# Patient Record
Sex: Male | Born: 1970
Health system: Southern US, Community
[De-identification: ages and names within clinical notes are randomized; demographics above are authoritative.]

## PROBLEM LIST (undated history)

## (undated) DIAGNOSIS — E119 Type 2 diabetes mellitus without complications: Secondary | ICD-10-CM

## (undated) DIAGNOSIS — I509 Heart failure, unspecified: Secondary | ICD-10-CM

## (undated) DIAGNOSIS — I2510 Atherosclerotic heart disease of native coronary artery without angina pectoris: Secondary | ICD-10-CM

## (undated) DIAGNOSIS — I10 Essential (primary) hypertension: Secondary | ICD-10-CM

## (undated) HISTORY — PX: CORONARY ARTERY BYPASS GRAFT: SHX141

---

## 2013-09-15 DIAGNOSIS — E119 Type 2 diabetes mellitus without complications: Secondary | ICD-10-CM

## 2015-05-17 DIAGNOSIS — I255 Ischemic cardiomyopathy: Secondary | ICD-10-CM | POA: Diagnosis present

## 2015-05-17 DIAGNOSIS — I2510 Atherosclerotic heart disease of native coronary artery without angina pectoris: Secondary | ICD-10-CM | POA: Diagnosis present

## 2015-06-11 DIAGNOSIS — I10 Essential (primary) hypertension: Secondary | ICD-10-CM | POA: Diagnosis present

## 2016-11-23 ENCOUNTER — Encounter (HOSPITAL_BASED_OUTPATIENT_CLINIC_OR_DEPARTMENT_OTHER): Payer: Self-pay | Admitting: *Deleted

## 2016-11-23 ENCOUNTER — Inpatient Hospital Stay (HOSPITAL_BASED_OUTPATIENT_CLINIC_OR_DEPARTMENT_OTHER)
Admission: EM | Admit: 2016-11-23 | Discharge: 2016-11-25 | DRG: 194 | Disposition: A | Discharge status: Home / Self Care | Payer: BLUE CROSS/BLUE SHIELD | Attending: Family Medicine | Admitting: Family Medicine

## 2016-11-23 ENCOUNTER — Emergency Department (HOSPITAL_BASED_OUTPATIENT_CLINIC_OR_DEPARTMENT_OTHER): Payer: BLUE CROSS/BLUE SHIELD

## 2016-11-23 DIAGNOSIS — E1165 Type 2 diabetes mellitus with hyperglycemia: Secondary | ICD-10-CM | POA: Diagnosis present

## 2016-11-23 DIAGNOSIS — Z833 Family history of diabetes mellitus: Secondary | ICD-10-CM

## 2016-11-23 DIAGNOSIS — Z951 Presence of aortocoronary bypass graft: Secondary | ICD-10-CM

## 2016-11-23 DIAGNOSIS — I5022 Chronic systolic (congestive) heart failure: Secondary | ICD-10-CM | POA: Diagnosis present

## 2016-11-23 DIAGNOSIS — E785 Hyperlipidemia, unspecified: Secondary | ICD-10-CM | POA: Diagnosis present

## 2016-11-23 DIAGNOSIS — Z87891 Personal history of nicotine dependence: Secondary | ICD-10-CM

## 2016-11-23 DIAGNOSIS — Z8249 Family history of ischemic heart disease and other diseases of the circulatory system: Secondary | ICD-10-CM

## 2016-11-23 DIAGNOSIS — I255 Ischemic cardiomyopathy: Secondary | ICD-10-CM | POA: Diagnosis present

## 2016-11-23 DIAGNOSIS — I10 Essential (primary) hypertension: Secondary | ICD-10-CM | POA: Diagnosis present

## 2016-11-23 DIAGNOSIS — I2510 Atherosclerotic heart disease of native coronary artery without angina pectoris: Secondary | ICD-10-CM | POA: Diagnosis present

## 2016-11-23 DIAGNOSIS — N179 Acute kidney failure, unspecified: Secondary | ICD-10-CM

## 2016-11-23 DIAGNOSIS — R05 Cough: Secondary | ICD-10-CM | POA: Diagnosis not present

## 2016-11-23 DIAGNOSIS — I110 Hypertensive heart disease with heart failure: Secondary | ICD-10-CM | POA: Diagnosis present

## 2016-11-23 DIAGNOSIS — R739 Hyperglycemia, unspecified: Secondary | ICD-10-CM

## 2016-11-23 DIAGNOSIS — E119 Type 2 diabetes mellitus without complications: Secondary | ICD-10-CM

## 2016-11-23 DIAGNOSIS — J189 Pneumonia, unspecified organism: Principal | ICD-10-CM | POA: Diagnosis present

## 2016-11-23 DIAGNOSIS — Z7984 Long term (current) use of oral hypoglycemic drugs: Secondary | ICD-10-CM

## 2016-11-23 HISTORY — DX: Type 2 diabetes mellitus without complications: E11.9

## 2016-11-23 HISTORY — DX: Atherosclerotic heart disease of native coronary artery without angina pectoris: I25.10

## 2016-11-23 HISTORY — DX: Essential (primary) hypertension: I10

## 2016-11-23 LAB — CBC WITH DIFFERENTIAL/PLATELET
Basophils Absolute: 0 10*3/uL (ref 0.0–0.1)
Basophils Relative: 0 %
Eosinophils Absolute: 0 10*3/uL (ref 0.0–0.7)
Eosinophils Relative: 0 %
HEMATOCRIT: 43.3 % (ref 39.0–52.0)
HEMOGLOBIN: 14.2 g/dL (ref 13.0–17.0)
LYMPHS ABS: 1.9 10*3/uL (ref 0.7–4.0)
LYMPHS PCT: 17 %
MCH: 28.7 pg (ref 26.0–34.0)
MCHC: 32.8 g/dL (ref 30.0–36.0)
MCV: 87.5 fL (ref 78.0–100.0)
MONOS PCT: 11 %
Monocytes Absolute: 1.2 10*3/uL — ABNORMAL HIGH (ref 0.1–1.0)
NEUTROS ABS: 8 10*3/uL — AB (ref 1.7–7.7)
NEUTROS PCT: 72 %
Platelets: 309 10*3/uL (ref 150–400)
RBC: 4.95 MIL/uL (ref 4.22–5.81)
RDW: 13.2 % (ref 11.5–15.5)
WBC: 11.1 10*3/uL — ABNORMAL HIGH (ref 4.0–10.5)

## 2016-11-23 LAB — I-STAT CG4 LACTIC ACID, ED: Lactic Acid, Venous: 1.56 mmol/L (ref 0.5–1.9)

## 2016-11-23 LAB — BASIC METABOLIC PANEL
Anion gap: 9 (ref 5–15)
BUN: 13 mg/dL (ref 6–20)
CO2: 28 mmol/L (ref 22–32)
Calcium: 9.1 mg/dL (ref 8.9–10.3)
Chloride: 99 mmol/L — ABNORMAL LOW (ref 101–111)
Creatinine, Ser: 1.33 mg/dL — ABNORMAL HIGH (ref 0.61–1.24)
GFR calc Af Amer: >60 mL/min (ref >60)
GFR calc non Af Amer: >60 mL/min (ref >60)
Glucose, Bld: 360 mg/dL — ABNORMAL HIGH (ref 65–99)
Potassium: 4 mmol/L (ref 3.5–5.1)
Sodium: 136 mmol/L (ref 135–145)

## 2016-11-23 MED ORDER — AZITHROMYCIN 500 MG IV SOLR
500.0000 mg | Freq: Once | INTRAVENOUS | Status: AC
  Administered 2016-11-23: 500 mg via INTRAVENOUS

## 2016-11-23 MED ORDER — SODIUM CHLORIDE 0.9 % IV BOLUS (SEPSIS)
1000.0000 mL | Freq: Once | INTRAVENOUS | Status: AC
  Administered 2016-11-23: 1000 mL via INTRAVENOUS

## 2016-11-23 MED ORDER — DEXTROSE 5 % IV SOLN
1.0000 g | Freq: Once | INTRAVENOUS | Status: AC
  Administered 2016-11-23: 1 g via INTRAVENOUS
  Filled 2016-11-23: qty 10

## 2016-11-23 MED ORDER — AZITHROMYCIN 500 MG IV SOLR
INTRAVENOUS | Status: AC
  Filled 2016-11-23: qty 500

## 2016-11-23 MED ORDER — IOPAMIDOL (ISOVUE-300) INJECTION 61%
100.0000 mL | Freq: Once | INTRAVENOUS | Status: AC | PRN
  Administered 2016-11-23: 100 mL via INTRAVENOUS

## 2016-11-23 NOTE — ED Notes (Signed)
[facility] paged for admission.

## 2016-11-23 NOTE — ED Provider Notes (Signed)
[facility]Y [facility] Provider Note   CSN: [id number] Arrival date & time: 11/23/16  1550 By signing my name below, I, Oscar Shelton, attest that this documentation has been prepared under the direction and in the presence of [name], PA-C. Electronically Signed: Linus Shelton, ED Scribe. 11/23/16. 7:37 PM.   History   Chief Complaint Chief Complaint  Patient presents with  . Cough    The history is provided by the patient. No language interpreter was used.   HPI Comments: Oscar Shelton is a 45 y.o. male who presents to the Emergency Department with a PMHx of CAD complaining of a persistent cough with secondary SOB and pleuritic CP that began 2 weeks ago but has been worse for the past 3 days. Pt also reports rhinorrhea. Pt reports he was seen at [facility] and prescribed hydrocortisone, cough syrup and inhaler. He has been complainant with those regimens but does not feel better. Pt denies any fevers, chills, N/V/D or any other symptoms at this time. Pt denies any recent travels or surgeries. Pt denies hx of PE, DVT, COPD, and asthma. Pt denies alcohol or drug abuse. Pt is a former smoker.  Past Medical History:  Diagnosis Date  . Coronary artery disease     There are no active problems to display for this patient.   Past Surgical History:  Procedure Laterality Date  . CORONARY ARTERY BYPASS GRAFT       Home Medications    Prior to Admission medications   Medication Sig Start Date End Date Taking? Authorizing Provider  AMLODIPINE-ATORVASTATIN PO Take by mouth.   Yes Historical Provider, MD  Atorvastatin Calcium (LIPITOR PO) Take by mouth.   Yes Historical Provider, MD  Furosemide (LASIX PO) Take by mouth.   Yes Historical Provider, MD  METFORMIN HCL PO Take by mouth.   Yes Historical Provider, MD    Family History No family history on file.  Social History Social History  Substance Use Topics  . Smoking status: Former Games developer  . Smokeless  tobacco: Never Used  . Alcohol use Yes     Allergies   Patient has no known allergies.   Review of Systems Review of Systems  Constitutional: Negative for chills and fever.  Respiratory: Positive for cough and shortness of breath.   Cardiovascular: Positive for chest pain (pleuritic).  Gastrointestinal: Negative for diarrhea, nausea and vomiting.   Physical Exam Updated Vital Signs BP 147/98   Pulse 104   Temp 99.6 F (37.6 C) (Oral)   Resp 22   Ht 6' (1.829 m)   Wt 262 lb (118.8 kg)   SpO2 97%   BMI 35.53 kg/m   Physical Exam  Constitutional: He is oriented to person, place, and time. He appears well-developed and well-nourished.  HENT:  Head: Normocephalic.  Right Ear: Tympanic membrane normal.  Left Ear: Tympanic membrane normal.  Nose: Nose normal.  Mouth/Throat: Uvula is midline, oropharynx is clear and moist and mucous membranes are normal. No tonsillar exudate.  Eyes: Conjunctivae are normal.  Cardiovascular: Tachycardia present.  Exam reveals no gallop and no friction rub.   No murmur heard. Pulmonary/Chest: Effort normal. He has no wheezes. He has no rales.  Crackles in the right lower lobe.   Neurological: He is alert and oriented to person, place, and time.  Skin: Skin is warm and dry.  Psychiatric: He has a normal mood and affect.  Nursing note and vitals reviewed.  ED Treatments / Results  DIAGNOSTIC STUDIES: Oxygen Saturation  is 97% on room air, normal by my interpretation.    COORDINATION OF CARE: 7:37 PM Discussed treatment plan with pt at bedside and pt agreed to plan.  Labs (all labs ordered are listed, but only abnormal results are displayed) Labs Reviewed  BASIC METABOLIC PANEL - Abnormal; Notable for the following:       Result Value   Chloride 99 (*)    Glucose, Bld 360 (*)    Creatinine, Ser 1.33 (*)    All other components within normal limits  CBC WITH DIFFERENTIAL/PLATELET - Abnormal; Notable for the following:    WBC 11.1 (*)     Neutro Abs 8.0 (*)    Monocytes Absolute 1.2 (*)    All other components within normal limits  TROPONIN I  I-STAT CG4 LACTIC ACID, ED    EKG  EKG Interpretation None     ED ECG REPORT   Date: 11/24/2016  Rate: 101  Rhythm: sinus tachycardia  QRS Axis: normal  Intervals: normal  ST/T Wave abnormalities: nonspecific T wave changes  Conduction Disutrbances:none  Narrative Interpretation:   Old EKG Reviewed: unchanged  I have personally reviewed the EKG tracing and agree with the computerized printout as noted.   Radiology Dg Chest 2 View  Result Date: 11/23/2016 CLINICAL DATA:  Nose 2 weeks ago with bronchitis due to cough and chest congestion. Completed antibiotic 3 would days ago with return of cough and fever and pleuritic chest tightness. Former smoker. EXAM: CHEST  2 VIEW COMPARISON:  None in PACs FINDINGS: The lungs are well-expanded. The interstitial markings are coarse bilaterally. There is subtle nodularity that projects in the lower third of the right lung likely in the lower lobe. No corresponding finding is observed on the left. The heart and pulmonary vascularity are normal. The mediastinum is normal in width. The patient has undergone previous median sternotomy. The sternal wires are intact with exception of the uppermost wire. The observed bony thorax exhibits no acute abnormality. IMPRESSION: Mild interstitial prominence bilaterally which may reflect the patient's smoking history or could be related to interstitial pneumonia. Subtle nodularity is present in the right lower lobe. Given the return of symptoms, recent completion of antibiotic therapy, and the smoking history, chest CT scanning is recommended. Electronically Signed   By: David  SwazilandJordan M.D.   On: 11/23/2016 16:25   Procedures Procedures (including critical care time)  Medications Ordered in ED Medications  azithromycin (ZITHROMAX) 500 MG injection (  Not Given 11/23/16 2304)  insulin aspart (novoLOG)  injection 10 Units (not administered)  benzonatate (TESSALON) capsule 100 mg (not administered)  iopamidol (ISOVUE-300) 61 % injection 100 mL (100 mLs Intravenous Contrast Given 11/23/16 2122)  cefTRIAXone (ROCEPHIN) 1 g in dextrose 5 % 50 mL IVPB (0 g Intravenous Stopped 11/23/16 2304)  azithromycin (ZITHROMAX) 500 mg in dextrose 5 % 250 mL IVPB (0 mg Intravenous Stopped 11/24/16 0005)  sodium chloride 0.9 % bolus 1,000 mL (0 mLs Intravenous Stopped 11/24/16 0006)     Initial Impression / Assessment and Plan / ED Course  I have reviewed the triage vital signs and the nursing notes.  Pertinent labs & imaging results that were available during my care of the patient were reviewed by me and considered in my medical decision making (see chart for details).  Clinical Course     BP 141/97 (BP Location: Left Arm)   Pulse 98   Temp 99.6 F (37.6 C) (Oral)   Resp 21   Ht 6' (1.829 m)  Wt 118.8 kg   SpO2 97%   BMI 35.53 kg/m    Final Clinical Impressions(s) / ED Diagnoses   Final diagnoses:  Multifocal pneumonia  Hyperglycemia  AKI (acute kidney injury) (HCC)    New Prescriptions New Prescriptions   No medications on file   I personally performed the services described in this documentation, which was scribed in my presence. The recorded information has been reviewed and is accurate.     10:06 PM Pt presents with fever, chills, productive cough.  Recently treated for bronchitis with zpak a week ago but sxs worsen.  He is tachypneic, tachycardic, elevated WBC 11.1, renal insuffiency with Cr. 1.36, hyperglycemic CBG 360, normal anion gap.  Will check lactic acid.  Pt will benefit from admission due to failed outpt therapy.  Rocephin/Zithromax given for multifocal pneumonia CAP.  Pt agrees to be [facility] to [facility].  I have reached out to hospitalist, Dr Doretha ImusGranata who will check on bed availability and will return my call.  Care discussed with Dr. Rush Landmarkegeler.    11:18 PM [facility]  does not have any bed available.  Will consult [facility] hospitalist for admission.  Pt has normal lactic acid, code sepsis not initiated.   12:05 AM Appreciate consultation from [facility], Dr. Adela Glimpseoutova who recommend pt to [facility] for his multifocal pneumonia.  Pt agrees to be transfer, he is currently stable.  Insulin given for his hyperglycemia.  Pt admits he's a diabetic.  Cough medication provided.     Fayrene HelperBowie Jakiya Bookbinder, PA-C 11/24/16 0038    Canary Brimhristopher J Tegeler, MD 11/24/16 [phone]

## 2016-11-23 NOTE — ED Notes (Signed)
[facility]

## 2016-11-23 NOTE — ED Notes (Signed)
[facility] notified that a call has not been received from [facility]. [facility] will re-page.

## 2016-11-23 NOTE — Plan of Care (Signed)
45 yo w hx of CAD With multifocal pneumonia lactic acid normal having pleuretic chest Pain noted to have BG 360 new diagnosis of DM? Started Rocephin and azithromycin  99.6 RR 21, 97% RA, HR 98 141/97 [facility]   Novie Maggio 12:03 AM

## 2016-11-23 NOTE — ED Triage Notes (Signed)
Cough x 2 weeks. [facility] given meds with some relief. He got a little better and now the cough is back and worse.

## 2016-11-23 NOTE — ED Notes (Signed)
Pt updated that [facility] does not have any beds at this time

## 2016-11-24 ENCOUNTER — Encounter (HOSPITAL_BASED_OUTPATIENT_CLINIC_OR_DEPARTMENT_OTHER): Payer: Self-pay | Admitting: *Deleted

## 2016-11-24 DIAGNOSIS — E785 Hyperlipidemia, unspecified: Secondary | ICD-10-CM | POA: Diagnosis present

## 2016-11-24 DIAGNOSIS — I255 Ischemic cardiomyopathy: Secondary | ICD-10-CM

## 2016-11-24 DIAGNOSIS — I10 Essential (primary) hypertension: Secondary | ICD-10-CM | POA: Diagnosis not present

## 2016-11-24 DIAGNOSIS — E119 Type 2 diabetes mellitus without complications: Secondary | ICD-10-CM

## 2016-11-24 DIAGNOSIS — J189 Pneumonia, unspecified organism: Principal | ICD-10-CM

## 2016-11-24 DIAGNOSIS — I110 Hypertensive heart disease with heart failure: Secondary | ICD-10-CM | POA: Diagnosis present

## 2016-11-24 DIAGNOSIS — Z833 Family history of diabetes mellitus: Secondary | ICD-10-CM | POA: Diagnosis not present

## 2016-11-24 DIAGNOSIS — I2510 Atherosclerotic heart disease of native coronary artery without angina pectoris: Secondary | ICD-10-CM

## 2016-11-24 DIAGNOSIS — Z951 Presence of aortocoronary bypass graft: Secondary | ICD-10-CM | POA: Diagnosis not present

## 2016-11-24 DIAGNOSIS — Z7984 Long term (current) use of oral hypoglycemic drugs: Secondary | ICD-10-CM | POA: Diagnosis not present

## 2016-11-24 DIAGNOSIS — I5022 Chronic systolic (congestive) heart failure: Secondary | ICD-10-CM | POA: Diagnosis present

## 2016-11-24 DIAGNOSIS — E1165 Type 2 diabetes mellitus with hyperglycemia: Secondary | ICD-10-CM | POA: Diagnosis present

## 2016-11-24 DIAGNOSIS — R05 Cough: Secondary | ICD-10-CM | POA: Diagnosis present

## 2016-11-24 DIAGNOSIS — Z8249 Family history of ischemic heart disease and other diseases of the circulatory system: Secondary | ICD-10-CM | POA: Diagnosis not present

## 2016-11-24 DIAGNOSIS — Z87891 Personal history of nicotine dependence: Secondary | ICD-10-CM | POA: Diagnosis not present

## 2016-11-24 LAB — RESPIRATORY PANEL BY PCR
ADENOVIRUS-RVPPCR: NOT DETECTED
Bordetella pertussis: NOT DETECTED
CORONAVIRUS 229E-RVPPCR: NOT DETECTED
CORONAVIRUS HKU1-RVPPCR: NOT DETECTED
CORONAVIRUS NL63-RVPPCR: NOT DETECTED
CORONAVIRUS OC43-RVPPCR: NOT DETECTED
Chlamydophila pneumoniae: NOT DETECTED
INFLUENZA B-RVPPCR: NOT DETECTED
Influenza A: NOT DETECTED
METAPNEUMOVIRUS-RVPPCR: NOT DETECTED
MYCOPLASMA PNEUMONIAE-RVPPCR: NOT DETECTED
PARAINFLUENZA VIRUS 1-RVPPCR: NOT DETECTED
PARAINFLUENZA VIRUS 2-RVPPCR: NOT DETECTED
Parainfluenza Virus 3: NOT DETECTED
Parainfluenza Virus 4: NOT DETECTED
Respiratory Syncytial Virus: NOT DETECTED
Rhinovirus / Enterovirus: NOT DETECTED

## 2016-11-24 LAB — CBC
HCT: 42.4 % (ref 39.0–52.0)
HEMOGLOBIN: 14 g/dL (ref 13.0–17.0)
MCH: 29 pg (ref 26.0–34.0)
MCHC: 33 g/dL (ref 30.0–36.0)
MCV: 87.8 fL (ref 78.0–100.0)
PLATELETS: 285 10*3/uL (ref 150–400)
RBC: 4.83 MIL/uL (ref 4.22–5.81)
RDW: 13.2 % (ref 11.5–15.5)
WBC: 11.2 10*3/uL — AB (ref 4.0–10.5)

## 2016-11-24 LAB — BASIC METABOLIC PANEL
ANION GAP: 8 (ref 5–15)
BUN: 8 mg/dL (ref 6–20)
CHLORIDE: 99 mmol/L — AB (ref 101–111)
CO2: 29 mmol/L (ref 22–32)
Calcium: 9.2 mg/dL (ref 8.9–10.3)
Creatinine, Ser: 1.1 mg/dL (ref 0.61–1.24)
GFR calc Af Amer: >60 mL/min (ref >60)
Glucose, Bld: 228 mg/dL — ABNORMAL HIGH (ref 65–99)
POTASSIUM: 4.5 mmol/L (ref 3.5–5.1)
SODIUM: 136 mmol/L (ref 135–145)

## 2016-11-24 LAB — CBC WITH DIFFERENTIAL/PLATELET
Basophils Absolute: 0.1 10*3/uL (ref 0.0–0.1)
Basophils Relative: 1 %
EOS ABS: 0.1 10*3/uL (ref 0.0–0.7)
Eosinophils Relative: 1 %
HEMATOCRIT: 45.1 % (ref 39.0–52.0)
HEMOGLOBIN: 14.9 g/dL (ref 13.0–17.0)
LYMPHS ABS: 1.9 10*3/uL (ref 0.7–4.0)
Lymphocytes Relative: 18 %
MCH: 29 pg (ref 26.0–34.0)
MCHC: 33 g/dL (ref 30.0–36.0)
MCV: 87.9 fL (ref 78.0–100.0)
Monocytes Absolute: 1 10*3/uL (ref 0.1–1.0)
Monocytes Relative: 9 %
NEUTROS ABS: 7.2 10*3/uL (ref 1.7–7.7)
NEUTROS PCT: 71 %
Platelets: 297 10*3/uL (ref 150–400)
RBC: 5.13 MIL/uL (ref 4.22–5.81)
RDW: 13.1 % (ref 11.5–15.5)
WBC: 10.2 10*3/uL (ref 4.0–10.5)

## 2016-11-24 LAB — INFLUENZA PANEL BY PCR (TYPE A & B)
INFLAPCR: NEGATIVE
INFLBPCR: NEGATIVE

## 2016-11-24 LAB — EXPECTORATED SPUTUM ASSESSMENT W REFEX TO RESP CULTURE

## 2016-11-24 LAB — EXPECTORATED SPUTUM ASSESSMENT W GRAM STAIN, RFLX TO RESP C

## 2016-11-24 LAB — GLUCOSE, CAPILLARY
GLUCOSE-CAPILLARY: 188 mg/dL — AB (ref 65–99)
GLUCOSE-CAPILLARY: 395 mg/dL — AB (ref 65–99)
Glucose-Capillary: 237 mg/dL — ABNORMAL HIGH (ref 65–99)
Glucose-Capillary: 215 mg/dL — ABNORMAL HIGH (ref 65–99)

## 2016-11-24 LAB — STREP PNEUMONIAE URINARY ANTIGEN: STREP PNEUMO URINARY ANTIGEN: POSITIVE — AB

## 2016-11-24 LAB — CBG MONITORING, ED: Glucose-Capillary: 357 mg/dL — ABNORMAL HIGH (ref 65–99)

## 2016-11-24 LAB — PROCALCITONIN: Procalcitonin: <0.1 ng/mL

## 2016-11-24 LAB — TROPONIN I: Troponin I: <0.03 ng/mL (ref <0.03)

## 2016-11-24 LAB — HIV ANTIBODY (ROUTINE TESTING W REFLEX): HIV SCREEN 4TH GENERATION: NONREACTIVE

## 2016-11-24 MED ORDER — BENZONATATE 100 MG PO CAPS
100.0000 mg | ORAL_CAPSULE | Freq: Once | ORAL | Status: AC
  Administered 2016-11-24: 100 mg via ORAL
  Filled 2016-11-24: qty 1

## 2016-11-24 MED ORDER — ALBUTEROL SULFATE (2.5 MG/3ML) 0.083% IN NEBU: 3.0000 mL | INHALATION_SOLUTION | RESPIRATORY_TRACT | Status: DC | PRN

## 2016-11-24 MED ORDER — AMIODARONE HCL 200 MG PO TABS
200.0000 mg | ORAL_TABLET | Freq: Every day | ORAL | Status: DC
  Administered 2016-11-24 – 2016-11-25 (×2): 200 mg via ORAL
  Filled 2016-11-24 (×2): qty 1

## 2016-11-24 MED ORDER — GUAIFENESIN-CODEINE 100-10 MG/5ML PO SOLN
5.0000 mL | Freq: Four times a day (QID) | ORAL | Status: DC | PRN
  Administered 2016-11-24 – 2016-11-25 (×3): 5 mL via ORAL
  Filled 2016-11-24 (×3): qty 5

## 2016-11-24 MED ORDER — DEXTROSE 5 % IV SOLN
500.0000 mg | INTRAVENOUS | Status: DC
  Administered 2016-11-25: 500 mg via INTRAVENOUS
  Filled 2016-11-24: qty 500

## 2016-11-24 MED ORDER — ATORVASTATIN CALCIUM 80 MG PO TABS
80.0000 mg | ORAL_TABLET | Freq: Every day | ORAL | Status: DC
  Administered 2016-11-24 – 2016-11-25 (×2): 80 mg via ORAL
  Filled 2016-11-24 (×2): qty 1

## 2016-11-24 MED ORDER — ACETAMINOPHEN 325 MG PO TABS: 650.0000 mg | ORAL_TABLET | Freq: Four times a day (QID) | ORAL | Status: DC | PRN

## 2016-11-24 MED ORDER — DEXTROSE 5 % IV SOLN
1.0000 g | INTRAVENOUS | Status: DC
  Filled 2016-11-24: qty 10

## 2016-11-24 MED ORDER — FUROSEMIDE 40 MG PO TABS
40.0000 mg | ORAL_TABLET | Freq: Every day | ORAL | Status: DC
  Administered 2016-11-24 – 2016-11-25 (×2): 40 mg via ORAL
  Filled 2016-11-24 (×2): qty 1

## 2016-11-24 MED ORDER — ENOXAPARIN SODIUM 60 MG/0.6ML ~~LOC~~ SOLN
60.0000 mg | SUBCUTANEOUS | Status: DC
  Administered 2016-11-24: 60 mg via SUBCUTANEOUS
  Filled 2016-11-24: qty 0.6

## 2016-11-24 MED ORDER — ACETAMINOPHEN 650 MG RE SUPP: 650.0000 mg | Freq: Four times a day (QID) | RECTAL | Status: DC | PRN

## 2016-11-24 MED ORDER — ONDANSETRON HCL 4 MG/2ML IJ SOLN: 4.0000 mg | Freq: Four times a day (QID) | INTRAMUSCULAR | Status: DC | PRN

## 2016-11-24 MED ORDER — INSULIN ASPART 100 UNIT/ML ~~LOC~~ SOLN
0.0000 [IU] | Freq: Every day | SUBCUTANEOUS | Status: DC
  Administered 2016-11-24: 5 [IU] via SUBCUTANEOUS

## 2016-11-24 MED ORDER — DEXTROSE 5 % IV SOLN
2.0000 g | INTRAVENOUS | Status: DC
  Administered 2016-11-24: 2 g via INTRAVENOUS
  Filled 2016-11-24: qty 2

## 2016-11-24 MED ORDER — INSULIN ASPART 100 UNIT/ML ~~LOC~~ SOLN
0.0000 [IU] | Freq: Three times a day (TID) | SUBCUTANEOUS | Status: DC
  Administered 2016-11-24 (×2): 5 [IU] via SUBCUTANEOUS
  Administered 2016-11-24: 3 [IU] via SUBCUTANEOUS
  Administered 2016-11-25: 8 [IU] via SUBCUTANEOUS
  Administered 2016-11-25: 11 [IU] via SUBCUTANEOUS

## 2016-11-24 MED ORDER — ONDANSETRON HCL 4 MG PO TABS: 4.0000 mg | ORAL_TABLET | Freq: Four times a day (QID) | ORAL | Status: DC | PRN

## 2016-11-24 MED ORDER — INSULIN ASPART 100 UNIT/ML ~~LOC~~ SOLN
10.0000 [IU] | Freq: Once | SUBCUTANEOUS | Status: AC
  Administered 2016-11-24: 10 [IU] via SUBCUTANEOUS
  Filled 2016-11-24: qty 1

## 2016-11-24 MED ORDER — ENOXAPARIN SODIUM 60 MG/0.6ML ~~LOC~~ SOLN: 60.0000 mg | SUBCUTANEOUS | Status: DC

## 2016-11-24 MED ORDER — METOPROLOL SUCCINATE ER 25 MG PO TB24
25.0000 mg | ORAL_TABLET | Freq: Every day | ORAL | Status: DC
  Administered 2016-11-24 – 2016-11-25 (×2): 25 mg via ORAL
  Filled 2016-11-24 (×2): qty 1

## 2016-11-24 MED ORDER — LOSARTAN POTASSIUM 25 MG PO TABS
25.0000 mg | ORAL_TABLET | Freq: Every day | ORAL | Status: DC
  Administered 2016-11-24 – 2016-11-25 (×2): 25 mg via ORAL
  Filled 2016-11-24 (×2): qty 1

## 2016-11-24 NOTE — Progress Notes (Signed)
I agree with note as per DR. Danford Patient admitted early am 12.5.17  45 y/o ? s/p CABG x 2 at 5944 Sys HF EF 35% per echo NS V tach on amiodarone Body mass index is 35.98 kg/m.  HLD Smoker till 2016 DM ty ii    Admitted with ongoing prod cough CXr=Bilat infiltrates  Rx for CAP  Looks good feels close to normal No wheeze Some prod sputum Sick child at home No vaccinations vs FLU No cp  P Change to Po ABx soon Cbc in am Ambulate Shower Home as early at 12/7 if all well  Pleas KochJai Giovoni Bunch, MD [facility] [phone]

## 2016-11-24 NOTE — Progress Notes (Signed)
Inpatient Diabetes Program Recommendations  AACE/ADA: New Consensus Statement on Inpatient Glycemic Control (2015)  Target Ranges:  Prepandial:   less than 140 mg/dL      Peak postprandial:   less than 180 mg/dL (1-2 hours)      Critically ill patients:  140 - 180 mg/dL   Results for Prince RomeRICHARDSON, Elester (MRN [id number]) as of 11/24/2016 11:16  Ref. Range 11/23/2016 20:23 11/24/2016 04:08  Glucose Latest Ref Range: 65 - 99 mg/dL 409360 (H) 811228 (H)   Review of Glycemic Control  Diabetes history: DM2 Outpatient Diabetes medications: Glyburide 5 mg daily, Metformin 1000 mg daily Current orders for Inpatient glycemic control: Novolog 0-15 units TID with meals, Novolog 0-5 units QHS  Inpatient Diabetes Program Recommendations: Insulin - Basal: Please consider ordering Lantus 10 units Q24H starting now. HgbA1C: A1C in process.  Thanks, Orlando PennerMarie Srihaan Mastrangelo, RN, MSN, CDE Diabetes Coordinator Inpatient Diabetes Program [phone] (Team Pager from 8am to 5pm)

## 2016-11-24 NOTE — H&P (Signed)
History and Physical  Patient Name: Oscar Shelton     ZOX:[id number]    DOB: [birth year]    DOA: 11/23/2016 PCP: No primary care provider on file.   Patient coming from: Home  Chief Complaint: Cough  HPI: Oscar Shelton is a 45 y.o. male with a past medical history significant for CAD s/p CABG age 45, CHF EF 35%, and NIDDM who presents with cough for 2 weeks, now worsening.  Two months ago, the patient developed a productive cough (he remembers this as "two and a half weeks ago" but chart review shows it was Oct. 4th).  [facility], diagnosed with bronchitis, and discharge with Z-Pak, prednisone, albuterol, and cough syrup.  He took these and felt better, until about the last week, when he again developed chest congestion, pleuritic pain, and shortness of breath.  He denied fever chills.  He denied leg swelling, orthopnea, PND.  ED course: -Temp 99.74F, heart rate 104, respirations 22, BP 147/98, pulse ox normal on room air -Na 136, K 4.0, Cr 1.33 (baseline 1.0-1.4), WBC 11.1K, Hgb 14.2 -Lactic acid 1.56, troponin negative -CXR showed bilateral patchy opacities and follow up CT chest confirmed bilateral patchy opacities, apparent multifocal pneumonia -He was given ceftriaxone and azithromycin and TRH were asked to evaluate for CAP         ROS: Review of Systems  Constitutional: Negative for chills and fever.  Respiratory: Positive for cough, sputum production and wheezing. Negative for shortness of breath.   Cardiovascular: Negative for chest pain and leg swelling.  All other systems reviewed and are negative.         Past Medical History:  Diagnosis Date  . Coronary artery disease   . Diabetes mellitus without complication (HCC)   . Hypertension     Past Surgical History:  Procedure Laterality Date  . CORONARY ARTERY BYPASS GRAFT      Social History: Patient lives with his wife, daguhter and son.  The patient walks unassisted.  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  He used to smoke, quit 2 yaers ago when he had his CABG.    No Known Allergies  Family history: family history includes Diabetes in his father; Heart disease in his father; Hypertension in his mother.  Prior to Admission medications   Medication Sig Start Date End Date Taking? Authorizing Provider  AMLODIPINE-ATORVASTATIN PO Take by mouth.   Yes Historical Provider, MD  Atorvastatin Calcium (LIPITOR PO) Take by mouth.   Yes Historical Provider, MD  Furosemide (LASIX PO) Take by mouth.   Yes Historical Provider, MD  METFORMIN HCL PO Take by mouth.   Yes Historical Provider, MD       Physical Exam: BP (!) 147/96 (BP Location: Right Arm)   Pulse 95   Temp 99.6 F (37.6 C) (Oral)   Resp 20   Ht 6\' 1"  (1.854 m)   Wt 123.7 kg (272 lb 11.2 oz)   SpO2 97%   BMI 35.98 kg/m  General appearance: Well-developed, adult male, alert and in no acute distress.   Eyes: Anicteric, conjunctiva pink, lids and lashes normal. PERRL.    ENT: No nasal deformity, discharge, epistaxis.  Hearing normal. OP moist without lesions.   Neck: No neck masses.  Trachea midline.  No thyromegaly/tenderness. Lymph: No cervical or supraclavicular lymphadenopathy. Skin: Warm and dry.  No jaundice.  No suspicious rashes or lesions. Cardiac: RRR, nl S1-S2, no murmurs appreciated.  Capillary refill is brisk.  JVP normal.  No LE edema.  Radial and DP pulses 2+ and symmetric. Respiratory: Normal respiratory rate and rhythm.  Diminihsed throughout.  No wheezes.  No rales. Abdomen: Abdomen soft.  No TTP. No ascites, distension, hepatosplenomegaly.   MSK: No deformities or effusions.  No cyanosis or clubbing. Neuro: Cranial nerves normal.  Sensation intact to light touch. Speech is fluent.  Muscle strength normal.    Psych: Sensorium intact and responding to questions, attention normal.  Behavior appropriate.  Affect normal.  Judgment and insight appear normal.     Labs on Admission:   I have personally reviewed following labs and imaging studies: CBC:  Recent Labs Lab 11/23/16 2023  WBC 11.1*  NEUTROABS 8.0*  HGB 14.2  HCT 43.3  MCV 87.5  PLT 309   Basic Metabolic Panel:  Recent Labs Lab 11/23/16 2023  NA 136  K 4.0  CL 99*  CO2 28  GLUCOSE 360*  BUN 13  CREATININE 1.33*  CALCIUM 9.1   GFR: Estimated Creatinine Clearance: 95.6 mL/min (by C-G formula based on SCr of 1.33 mg/dL (H)).  Liver Function Tests: No results for input(s): AST, ALT, ALKPHOS, BILITOT, PROT, ALBUMIN in the last 168 hours. No results for input(s): LIPASE, AMYLASE in the last 168 hours. No results for input(s): AMMONIA in the last 168 hours. Coagulation Profile: No results for input(s): INR, PROTIME in the last 168 hours. Cardiac Enzymes:  Recent Labs Lab 11/24/16 0024  TROPONINI <0.03   BNP (last 3 results) No results for input(s): PROBNP in the last 8760 hours. HbA1C: No results for input(s): HGBA1C in the last 72 hours. CBG:  Recent Labs Lab 11/24/16 0017  GLUCAP 357*   Lipid Profile: No results for input(s): CHOL, HDL, LDLCALC, TRIG, CHOLHDL, LDLDIRECT in the last 72 hours. Thyroid Function Tests: No results for input(s): TSH, T4TOTAL, FREET4, T3FREE, THYROIDAB in the last 72 hours. Anemia Panel: No results for input(s): VITAMINB12, FOLATE, FERRITIN, TIBC, IRON, RETICCTPCT in the last 72 hours. Sepsis Labs: Lactic acid 1.56 Invalid input(s): PROCALCITONIN, LACTICIDVEN No results found for this or any previous visit (from the past 240 hour(s)).       Radiological Exams on Admission: Personally reviewed CXR shows trace patchy infiltrates bilaterally, CT chest report reviewed: Dg Chest 2 View  Result Date: 11/23/2016 CLINICAL DATA:  Nose 2 weeks ago with bronchitis due to cough and chest congestion. Completed antibiotic 3 would days ago with return of cough and fever and pleuritic chest tightness. Former smoker. EXAM: CHEST  2 VIEW COMPARISON:  None in PACs  FINDINGS: The lungs are well-expanded. The interstitial markings are coarse bilaterally. There is subtle nodularity that projects in the lower third of the right lung likely in the lower lobe. No corresponding finding is observed on the left. The heart and pulmonary vascularity are normal. The mediastinum is normal in width. The patient has undergone previous median sternotomy. The sternal wires are intact with exception of the uppermost wire. The observed bony thorax exhibits no acute abnormality. IMPRESSION: Mild interstitial prominence bilaterally which may reflect the patient's smoking history or could be related to interstitial pneumonia. Subtle nodularity is present in the right lower lobe. Given the return of symptoms, recent completion of antibiotic therapy, and the smoking history, chest CT scanning is recommended. Electronically Signed   By: David  SwazilandJordan M.D.   On: 11/23/2016 16:25   Ct Chest W Contrast  Result Date: 11/23/2016 CLINICAL DATA:  Recurrent cough after course of antibiotics. EXAM: CT CHEST WITH  CONTRAST TECHNIQUE: Multidetector CT imaging of the chest was performed during intravenous contrast administration. CONTRAST:  100mL ISOVUE-300 IOPAMIDOL (ISOVUE-300) INJECTION 61% COMPARISON:  Radiographs 11/23/2016 FINDINGS: Cardiovascular: Moderate cardiomegaly. No acute vascular abnormality. Mediastinum/Nodes: Multiple mildly prominent nodes in the mediastinum and hila. These are nonspecific but may be reactive. The largest individual node is in the azygoesophageal region, 2 cm short axis. Lungs/Pleura: Patchy airspace opacities in the superior central right middle lobe and in the posterior central right lower lobe. Left lung is clear. Airways are patent and normal in caliber. Upper Abdomen: No significant abnormality. 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 No suspicious  skeletal lesions. IMPRESSION: 1. Patchy airspace opacities in the right middle lobe and right lower lobe which account for the radiographic abnormality is observed earlier today. This most likely represents multifocal pneumonia. There also are mildly prominent nodes in the hila and mediastinum which are probably reactive although not conclusively characterized. 2. Followup PA and lateral chest X-ray is recommended in 3-4 weeks following trial of antibiotic therapy to ensure resolution and exclude underlying malignancy. Electronically Signed   By: Ellery Plunkaniel R Mitchell M.D.   On: 11/23/2016 21:52    EKG: Independently reviewed. Rate 101, QTc 492, long normal, diffuse TW flattening.  Echocardiogram May 2017 in [facility]: EF 35% Moderate global LV hypokinesis. The anterior wall is hypokinesis Normal right ventricular size and function. Mild to Moderate mitral regurgitation by color flow doppler examination. Mild tricuspid regurgitation by color flow doppler examination. Moderate pulmonary hypertension. Estimated pulmonary artery pressure is 55mmHg.      Assessment/Plan  1. Community aquired pneumonia:  Meets SIRS criteria without evidence of organ failure/sepsis. -Ceftriaxone and azithromycin IV -Check flu swab and RVP -Obtain sputum culture -Obtain procalcitonin and S pneumo antigen -Albuterol PRN and cough syrup for symptoms    2. HTN and CAD secondary prevention:  -Continue BB and ARB -Continue statin  3. Chronic systolic CHF:  EF 35% -Continue furosemide  4. NIDDM:  -Hold metformin and glyburide while inpatient -SSI with meals  5. Hx of NS VT:  Some post-op NSVT after his CABG.  No arrhythmias on holter monitor with his Cardiologist, who is tapering amiodarone. -Continue amiodarone      DVT prophylaxis: Lovenox  Code Status: FULL  Family Communication: None present  Disposition Plan: Anticipate IV antibiotics and respiratory virus testing and de-escalate  antibiotics as able. Consults called: None Admission status: INPATIENT       Medical decision making: Patient seen at 2:50 AM on 11/24/2016.  What exists of the patient's chart was reviewed in depth and summarized above.  Clinical condition: stable.        Alberteen SamChristopher P Danford [facility] Pager [phone]

## 2016-11-24 NOTE — Progress Notes (Signed)
Pt. Arrived from [facility] in alert and stable condition. Pt. Denies andy pain at this time. Pt. Oriented to room and placed on telemetry. CCMD notified. Call light placed within reach. Admitting MD paged for orders. RN will continue to monitor pt. For changes in condition. Oscar Shelton, Cheryll DessertKaren Cherrell

## 2016-11-25 ENCOUNTER — Encounter: Payer: Self-pay | Admitting: Family Medicine

## 2016-11-25 LAB — GLUCOSE, CAPILLARY
GLUCOSE-CAPILLARY: 295 mg/dL — AB (ref 65–99)
GLUCOSE-CAPILLARY: 306 mg/dL — AB (ref 65–99)
Glucose-Capillary: 87 mg/dL (ref 65–99)

## 2016-11-25 LAB — HEMOGLOBIN A1C
HEMOGLOBIN A1C: 12.7 % — AB (ref 4.8–5.6)
MEAN PLASMA GLUCOSE: 318 mg/dL

## 2016-11-25 MED ORDER — LEVOFLOXACIN 750 MG PO TABS: 750.0000 mg | ORAL_TABLET | Freq: Every day | ORAL | 0 refills | Status: DC

## 2016-11-25 MED ORDER — LEVOFLOXACIN 750 MG PO TABS
750.0000 mg | ORAL_TABLET | Freq: Every day | ORAL | Status: AC
  Administered 2016-11-25: 750 mg via ORAL

## 2016-11-25 MED ORDER — LEVOFLOXACIN 500 MG PO TABS
750.0000 mg | ORAL_TABLET | Freq: Every day | ORAL | Status: DC
  Filled 2016-11-25: qty 2

## 2016-11-25 NOTE — Progress Notes (Signed)
Pt has orders to be discharged. Discharge instructions given and pt has no additional questions at this time. Medication regimen reviewed and pt educated. Pt verbalized understanding and has no additional questions. Telemetry box removed. IV removed and site in good condition. Pt stable and waiting for transportation.   Alissia Lory RN 

## 2016-11-25 NOTE — Care Management Note (Addendum)
Case Management Note  Patient Details  Name: Oscar Shelton MRN: [id number] Date of Birth: 1971/10/16  Subjective/Objective:  45 y.o. M admitted with Pne. To be discharged today. No PCP listed on facesheet and will need to follow up for labs in 10 days per Discharge summary. Discussed [email or web address] with pt to find PCP. Pt has been using Cardiologist as his PCP. If he is unable to find PCP in 10 days he will return to UC in [place].  Independent at home.                   Action/Plan:Anticipate Discharge Home today. No further CM needs. Will sign Off for now but will be available should discharge/disposition needs arise.    Expected Discharge Date:                  Expected Discharge Plan:     In-House Referral:     Discharge planning Services     Post Acute Care Choice:    Choice offered to:     DME Arranged:    DME Agency:     HH Arranged:    HH Agency:     Status of Service:     If discussed at [employer] of [employer] Meetings, dates discussed:    Additional Comments:  Oscar Shelton, Oscar Shelton M, RN 11/25/2016, 10:40 AM

## 2016-11-25 NOTE — Care Management Note (Deleted)
Case Management Note  Patient Details  Name: Prince RomeLance Pala MRN: [id number] Date of Birth: [birth year]  Subjective/Objective:                    Action/Plan:   Expected Discharge Date:                  Expected Discharge Plan:     In-House Referral:     Discharge planning Services  CM Consult  Post Acute Care Choice:  NA Choice offered to:  Patient  DME Arranged:    DME Agency:     HH Arranged:  NA HH Agency:     Status of Service:  Completed, signed off  If discussed at Long Length of Stay Meetings, dates discussed:    Additional Comments:  Yvone NeuCrutchfield, Avrohom Mckelvin M, RN 11/25/2016, 10:42 AM

## 2016-11-25 NOTE — Discharge Summary (Signed)
Physician Discharge Summary  Oscar Shelton ZOX:[id number] DOB: 08-11-1971 DOA: 11/23/2016  PCP: No primary care provider on file.  Admit date: 11/23/2016 Discharge date: 11/25/2016  Time spent: 30 minutes  Recommendations for Outpatient Follow-up:  1. Complete course of levaquin 2. Needs Labs as OP in 10 days  Discharge Diagnoses:  Principal Problem:   Multifocal pneumonia Active Problems:   Coronary artery disease involving native coronary artery of native heart without angina pectoris   Essential hypertension   Ischemic cardiomyopathy   Type 2 diabetes mellitus without complication, without long-term current use of insulin (HCC)   Discharge Condition: fair  Diet recommendation:  hh low salt dm  Filed Weights   11/23/16 1557 11/24/16 0155  Weight: 118.8 kg (262 lb) 123.7 kg (272 lb 11.2 oz)    History of present illness:   45 y/o ? s/p CABG x 2 at 43 Sys HF EF 35% per echo NS V tach on amiodarone Body mass index is 35.98 kg/m.  HLD Smoker till 2016 DM ty ii    Admitted with ongoing prod cough CXr=Bilat infiltrates  He was found to have strep pneumo in his urine and recovered rapidly on IV Azithromycin/ceftriaxone  He was transitioned rapidly to po levaquin and was given a note excusing him from work and was felt to have reasonably stabilized to go home on 11/25/16  Discharge Exam: Vitals:   11/24/16 2355 11/25/16 0533  BP: (!) 141/94 (!) 141/75  Pulse: 85 87  Resp: 18 18  Temp: 98.4 F (36.9 C) 98.4 F (36.9 C)    General: eomi ncat Cardiovascular: s1 s 2no m/r/g no jvd midline scar,  Respiratory: chest clear no added sound, no rales no rhonchi No le edema  Discharge Instructions   Discharge Instructions    Diet - low sodium heart healthy    Complete by:  As directed    Discharge instructions    Complete by:  As directed    Finish complete course of Abx Get labs in 10 days or so   Increase activity slowly    Complete by:  As directed       Current Discharge Medication List    START taking these medications   Details  levofloxacin (LEVAQUIN) 750 MG tablet Take 1 tablet (750 mg total) by mouth daily. Qty: 4 tablet, Refills: 0      CONTINUE these medications which have NOT CHANGED   Details  amiodarone (PACERONE) 200 MG tablet Take 200 mg by mouth daily. Refills: 0    atorvastatin (LIPITOR) 80 MG tablet Take 80 mg by mouth daily. Refills: 11    furosemide (LASIX) 40 MG tablet Take 40 mg by mouth daily. Refills: 11    glyBURIDE (DIABETA) 5 MG tablet Take 5 mg by mouth daily.    losartan (COZAAR) 25 MG tablet Take 25 mg by mouth daily. Refills: 11    metFORMIN (GLUCOPHAGE) 1000 MG tablet Take 1,000 mg by mouth daily. Refills: 1    metoprolol succinate (TOPROL-XL) 25 MG 24 hr tablet Take 25 mg by mouth daily. Refills: 11    VENTOLIN HFA 108 (90 Base) MCG/ACT inhaler Take 1 puff by mouth every 4 (four) hours as needed for wheezing or shortness of breath.  Refills: 0       No Known Allergies    The results of significant diagnostics from this hospitalization (including imaging, microbiology, ancillary and laboratory) are listed below for reference.    Significant Diagnostic Studies: Dg Chest 2 View  Result  Date: 11/23/2016 CLINICAL DATA:  Nose 2 weeks ago with bronchitis due to cough and chest congestion. Completed antibiotic 3 would days ago with return of cough and fever and pleuritic chest tightness. Former smoker. EXAM: CHEST  2 VIEW COMPARISON:  None in PACs FINDINGS: The lungs are well-expanded. The interstitial markings are coarse bilaterally. There is subtle nodularity that projects in the lower third of the right lung likely in the lower lobe. No corresponding finding is observed on the left. The heart and pulmonary vascularity are normal. The mediastinum is normal in width. The patient has undergone previous median sternotomy. The sternal wires are intact with exception of the uppermost wire. The  observed bony thorax exhibits no acute abnormality. IMPRESSION: Mild interstitial prominence bilaterally which may reflect the patient's smoking history or could be related to interstitial pneumonia. Subtle nodularity is present in the right lower lobe. Given the return of symptoms, recent completion of antibiotic therapy, and the smoking history, chest CT scanning is recommended. Electronically Signed   By: David  SwazilandJordan M.D.   On: 11/23/2016 16:25   Ct Chest W Contrast  Result Date: 11/23/2016 CLINICAL DATA:  Recurrent cough after course of antibiotics. EXAM: CT CHEST WITH CONTRAST TECHNIQUE: Multidetector CT imaging of the chest was performed during intravenous contrast administration. CONTRAST:  100mL ISOVUE-300 IOPAMIDOL (ISOVUE-300) INJECTION 61% COMPARISON:  Radiographs 11/23/2016 FINDINGS: Cardiovascular: Moderate cardiomegaly. No acute vascular abnormality. Mediastinum/Nodes: Multiple mildly prominent nodes in the mediastinum and hila. These are nonspecific but may be reactive. The largest individual node is in the azygoesophageal region, 2 cm short axis. Lungs/Pleura: Patchy airspace opacities in the superior central right middle lobe and in the posterior central right lower lobe. Left lung is clear. Airways are patent and normal in caliber. Upper Abdomen: No significant abnormality. 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 No suspicious skeletal lesions. IMPRESSION: 1. Patchy airspace opacities in the right middle lobe and right lower lobe which account for the radiographic abnormality is observed earlier today. This most likely represents multifocal pneumonia. There also are mildly prominent nodes in the hila and mediastinum which are probably reactive although not conclusively characterized. 2. Followup PA and lateral chest X-ray is recommended in 3-4 weeks following trial of  antibiotic therapy to ensure resolution and exclude underlying malignancy. Electronically Signed   By: Ellery Plunkaniel R Mitchell M.D.   On: 11/23/2016 21:52    Microbiology: Recent Results (from the past 240 hour(s))  Respiratory Panel by PCR     Status: None   Collection Time: 11/24/16  9:53 AM  Result Value Ref Range Status   Adenovirus NOT DETECTED NOT DETECTED Final   Coronavirus 229E NOT DETECTED NOT DETECTED Final   Coronavirus HKU1 NOT DETECTED NOT DETECTED Final   Coronavirus NL63 NOT DETECTED NOT DETECTED Final   Coronavirus OC43 NOT DETECTED NOT DETECTED Final   Metapneumovirus NOT DETECTED NOT DETECTED Final   Rhinovirus / Enterovirus NOT DETECTED NOT DETECTED Final   Influenza A NOT DETECTED NOT DETECTED Final   Influenza B NOT DETECTED NOT DETECTED Final   Parainfluenza Virus 1 NOT DETECTED NOT DETECTED Final   Parainfluenza Virus 2 NOT DETECTED NOT DETECTED Final   Parainfluenza Virus 3 NOT DETECTED NOT DETECTED Final   Parainfluenza Virus 4 NOT DETECTED NOT DETECTED Final   Respiratory Syncytial Virus NOT DETECTED NOT DETECTED Final   Bordetella pertussis NOT DETECTED NOT DETECTED Final   Chlamydophila pneumoniae NOT  DETECTED NOT DETECTED Final   Mycoplasma pneumoniae NOT DETECTED NOT DETECTED Final  Culture, sputum-assessment     Status: None   Collection Time: 11/24/16  8:57 PM  Result Value Ref Range Status   Specimen Description EXPECTORATED SPUTUM  Final   Special Requests NONE  Final   Sputum evaluation   Final    THIS SPECIMEN IS ACCEPTABLE. RESPIRATORY CULTURE REPORT TO FOLLOW.   Report Status 11/24/2016 FINAL  Final  Culture, respiratory (NON-Expectorated)     Status: None (Preliminary result)   Collection Time: 11/24/16  8:57 PM  Result Value Ref Range Status   Specimen Description EXPECTORATED SPUTUM  Final   Special Requests NONE  Final   Gram Stain   Final    ABUNDANT WBC PRESENT, PREDOMINANTLY PMN MODERATE GRAM POSITIVE COCCI IN PAIRS MODERATE GRAM  NEGATIVE RODS FEW GRAM POSITIVE RODS RARE GRAM NEGATIVE COCCI IN PAIRS    Culture PENDING  Incomplete   Report Status PENDING  Incomplete     Labs: Basic Metabolic Panel:  Recent Labs Lab 11/23/16 2023 11/24/16 0408  NA 136 136  K 4.0 4.5  CL 99* 99*  CO2 28 29  GLUCOSE 360* 228*  BUN 13 8  CREATININE 1.33* 1.10  CALCIUM 9.1 9.2   Liver Function Tests: No results for input(s): AST, ALT, ALKPHOS, BILITOT, PROT, ALBUMIN in the last 168 hours. No results for input(s): LIPASE, AMYLASE in the last 168 hours. No results for input(s): AMMONIA in the last 168 hours. CBC:  Recent Labs Lab 11/23/16 2023 11/24/16 0408 11/24/16 1118  WBC 11.1* 11.2* 10.2  NEUTROABS 8.0*  --  7.2  HGB 14.2 14.0 14.9  HCT 43.3 42.4 45.1  MCV 87.5 87.8 87.9  PLT 309 285 297   Cardiac Enzymes:  Recent Labs Lab 11/24/16 0024  TROPONINI <0.03   BNP: BNP (last 3 results) No results for input(s): BNP in the last 8760 hours.  ProBNP (last 3 results) No results for input(s): PROBNP in the last 8760 hours.  CBG:  Recent Labs Lab 11/24/16 0623 11/24/16 1132 11/24/16 1654 11/24/16 2124 11/25/16 0707  GLUCAP 237* 188* 215* 395* 295*       Signed:  Rhetta Mura MD   [facility] 11/25/2016, 9:58 AM

## 2016-11-27 LAB — CULTURE, RESPIRATORY: CULTURE: NORMAL

## 2016-11-27 LAB — CULTURE, RESPIRATORY W GRAM STAIN

## 2018-01-22 ENCOUNTER — Encounter (HOSPITAL_BASED_OUTPATIENT_CLINIC_OR_DEPARTMENT_OTHER): Payer: Self-pay | Admitting: *Deleted

## 2018-01-22 ENCOUNTER — Emergency Department (HOSPITAL_BASED_OUTPATIENT_CLINIC_OR_DEPARTMENT_OTHER)
Admission: EM | Admit: 2018-01-22 | Discharge: 2018-01-22 | Disposition: A | Discharge status: Home / Self Care | Payer: BLUE CROSS/BLUE SHIELD | Attending: Emergency Medicine | Admitting: Emergency Medicine

## 2018-01-22 ENCOUNTER — Other Ambulatory Visit: Payer: Self-pay

## 2018-01-22 ENCOUNTER — Emergency Department (HOSPITAL_BASED_OUTPATIENT_CLINIC_OR_DEPARTMENT_OTHER): Payer: BLUE CROSS/BLUE SHIELD

## 2018-01-22 DIAGNOSIS — Z951 Presence of aortocoronary bypass graft: Secondary | ICD-10-CM | POA: Diagnosis not present

## 2018-01-22 DIAGNOSIS — Z87891 Personal history of nicotine dependence: Secondary | ICD-10-CM | POA: Diagnosis not present

## 2018-01-22 DIAGNOSIS — R2243 Localized swelling, mass and lump, lower limb, bilateral: Secondary | ICD-10-CM | POA: Diagnosis present

## 2018-01-22 DIAGNOSIS — Z7984 Long term (current) use of oral hypoglycemic drugs: Secondary | ICD-10-CM | POA: Insufficient documentation

## 2018-01-22 DIAGNOSIS — R600 Localized edema: Secondary | ICD-10-CM | POA: Insufficient documentation

## 2018-01-22 DIAGNOSIS — I2510 Atherosclerotic heart disease of native coronary artery without angina pectoris: Secondary | ICD-10-CM | POA: Diagnosis not present

## 2018-01-22 DIAGNOSIS — Z79899 Other long term (current) drug therapy: Secondary | ICD-10-CM | POA: Diagnosis not present

## 2018-01-22 DIAGNOSIS — I10 Essential (primary) hypertension: Secondary | ICD-10-CM | POA: Diagnosis not present

## 2018-01-22 DIAGNOSIS — E119 Type 2 diabetes mellitus without complications: Secondary | ICD-10-CM | POA: Insufficient documentation

## 2018-01-22 DIAGNOSIS — R609 Edema, unspecified: Secondary | ICD-10-CM

## 2018-01-22 LAB — BRAIN NATRIURETIC PEPTIDE: B Natriuretic Peptide: 610.3 pg/mL — ABNORMAL HIGH (ref 0.0–100.0)

## 2018-01-22 LAB — CBC WITH DIFFERENTIAL/PLATELET
BASOS PCT: 0 %
Basophils Absolute: 0 10*3/uL (ref 0.0–0.1)
EOS ABS: 0.1 10*3/uL (ref 0.0–0.7)
Eosinophils Relative: 1 %
HCT: 39.7 % (ref 39.0–52.0)
HEMOGLOBIN: 12.8 g/dL — AB (ref 13.0–17.0)
LYMPHS ABS: 1.3 10*3/uL (ref 0.7–4.0)
Lymphocytes Relative: 18 %
MCH: 28.1 pg (ref 26.0–34.0)
MCHC: 32.2 g/dL (ref 30.0–36.0)
MCV: 87.3 fL (ref 78.0–100.0)
MONOS PCT: 11 %
Monocytes Absolute: 0.8 10*3/uL (ref 0.1–1.0)
NEUTROS PCT: 70 %
Neutro Abs: 5.1 10*3/uL (ref 1.7–7.7)
Platelets: 301 10*3/uL (ref 150–400)
RBC: 4.55 MIL/uL (ref 4.22–5.81)
RDW: 13.5 % (ref 11.5–15.5)
WBC: 7.3 10*3/uL (ref 4.0–10.5)

## 2018-01-22 LAB — BASIC METABOLIC PANEL
ANION GAP: 10 (ref 5–15)
BUN: 18 mg/dL (ref 6–20)
CO2: 29 mmol/L (ref 22–32)
Calcium: 8.9 mg/dL (ref 8.9–10.3)
Chloride: 100 mmol/L — ABNORMAL LOW (ref 101–111)
Creatinine, Ser: 1.27 mg/dL — ABNORMAL HIGH (ref 0.61–1.24)
GFR calc Af Amer: >60 mL/min (ref >60)
GFR calc non Af Amer: >60 mL/min (ref >60)
GLUCOSE: 175 mg/dL — AB (ref 65–99)
POTASSIUM: 3.9 mmol/L (ref 3.5–5.1)
Sodium: 139 mmol/L (ref 135–145)

## 2018-01-22 LAB — TROPONIN I

## 2018-01-22 MED ORDER — FUROSEMIDE 10 MG/ML IJ SOLN
80.0000 mg | Freq: Once | INTRAMUSCULAR | Status: AC
  Administered 2018-01-22: 80 mg via INTRAVENOUS
  Filled 2018-01-22: qty 8

## 2018-01-22 NOTE — ED Triage Notes (Signed)
Pt reports BLE swelling x 1 month. 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 Sx not improving, reports feeling "a little SOB". Feels like he's having indigestion.

## 2018-01-22 NOTE — ED Notes (Signed)
Patient transported to X-ray 

## 2018-01-22 NOTE — ED Provider Notes (Signed)
[facility] EMERGENCY DEPARTMENT Provider Note   CSN: [id number] Arrival date & time: 01/22/18  1458     History   Chief Complaint Chief Complaint  Patient presents with  . Leg Swelling    HPI Oscar Shelton is a 47 y.o. male.  Patient is a 47 year old male with a history of coronary artery disease, diabetes, hypertension who presents with leg swelling.  He states over the last month he has had increase in his lower leg swelling.  He denies any chest pain or shortness of breath.  He denies any pain in his legs.  No injuries.  He has swelling in both of his legs.  No cough or chest congestion.  No fevers.  He has been seen twice in the [facility] regional emergency room.  The first time he increased his Lasix to 40 mg once a day to 40 mg twice a day.  He did this for 3 days and his symptoms improved.  However the swelling came back when he stopped the increased dose.  He had a second visit following that where he also increased his Lasix and currently he is taking Lasix 40 mg twice a day every day.  He states that when he wakes up in the morning the leg swelling is gone but during the day it comes back again.  He has no associated shortness of breath or chest pain.  He does have compression stockings at home but he does not use them.  He has an appointment to follow-up with his cardiologist coming up but he does not know exactly when it is.      Past Medical History:  Diagnosis Date  . Coronary artery disease   . Diabetes mellitus without complication (HCC)   . Hypertension     Patient Active Problem List   Diagnosis Date Noted  . Multifocal pneumonia 11/24/2016  . Essential hypertension 06/11/2015  . Coronary artery disease involving native coronary artery of native heart without angina pectoris 05/17/2015  . Ischemic cardiomyopathy 05/17/2015  . Type 2 diabetes mellitus without complication, without long-term current use of insulin (HCC) 09/15/2013    Past  Surgical History:  Procedure Laterality Date  . CORONARY ARTERY BYPASS GRAFT         Home Medications    Prior to Admission medications   Medication Sig Start Date End Date Taking? Authorizing Provider  amiodarone (PACERONE) 200 MG tablet Take 200 mg by mouth daily. 10/14/16  Yes [provider]  atorvastatin (LIPITOR) 80 MG tablet Take 80 mg by mouth daily. 11/21/16  Yes [provider]  furosemide (LASIX) 40 MG tablet Take 40 mg by mouth daily. 11/21/16  Yes [provider]  glyBURIDE (DIABETA) 5 MG tablet Take 5 mg by mouth daily. 12/10/14  Yes [provider]  metFORMIN (GLUCOPHAGE) 1000 MG tablet Take 1,000 mg by mouth daily. 11/19/16  Yes [provider]  metoprolol succinate (TOPROL-XL) 25 MG 24 hr tablet Take 25 mg by mouth daily. 11/21/16  Yes [provider]  levofloxacin (LEVAQUIN) 750 MG tablet Take 1 tablet (750 mg total) by mouth daily. 11/25/16   Rhetta Mura, MD  losartan (COZAAR) 25 MG tablet Take 25 mg by mouth daily. 11/21/16   [provider]  VENTOLIN HFA 108 (90 Base) MCG/ACT inhaler Take 1 puff by mouth every 4 (four) hours as needed for wheezing or shortness of breath.  09/23/16   [provider]    Family History Family History  Problem  Relation Age of Onset  . Hypertension Mother   . Diabetes Father   . Heart disease Father     Social History Social History   Tobacco Use  . Smoking status: Former Games developermoker  . Smokeless tobacco: Never Used  Substance Use Topics  . Alcohol use: Yes    Comment: beer occasional  . Drug use: No     Allergies   Patient has no known allergies.   Review of Systems Review of Systems  Constitutional: Negative for chills, diaphoresis, fatigue and fever.  HENT: Negative for congestion, rhinorrhea and sneezing.   Eyes: Negative.   Respiratory: Negative for cough, chest tightness and shortness of breath.   Cardiovascular: Positive for leg swelling.  Negative for chest pain.  Gastrointestinal: Negative for abdominal pain, blood in stool, diarrhea, nausea and vomiting.  Genitourinary: Negative for difficulty urinating, flank pain, frequency and hematuria.  Musculoskeletal: Negative for arthralgias and back pain.  Skin: Negative for rash.  Neurological: Negative for dizziness, speech difficulty, weakness, numbness and headaches.     Physical Exam Updated Vital Signs BP (!) 149/101 (BP Location: Left Arm)   Pulse 97   Temp 99.8 F (37.7 C) (Oral)   Resp 20   Ht 6' (1.829 m)   Wt 122.5 kg (270 lb)   SpO2 93%   BMI 36.62 kg/m   Physical Exam  Constitutional: He is oriented to person, place, and time. He appears well-developed and well-nourished.  HENT:  Head: Normocephalic and atraumatic.  Eyes: Pupils are equal, round, and reactive to light.  Neck: Normal range of motion. Neck supple.  Cardiovascular: Normal rate, regular rhythm and normal heart sounds.  Pulmonary/Chest: Effort normal and breath sounds normal. No respiratory distress. He has no wheezes. He has no rales. He exhibits no tenderness.  Abdominal: Soft. Bowel sounds are normal. There is no tenderness. There is no rebound and no guarding.  Musculoskeletal: Normal range of motion. He exhibits edema.  2+ pitting edema bilaterally in the lower extremities up to the knees, pedal pulses are intact.  There is no warmth or erythema.  Lymphadenopathy:    He has no cervical adenopathy.  Neurological: He is alert and oriented to person, place, and time.  Skin: Skin is warm and dry. No rash noted.  Psychiatric: He has a normal mood and affect.     ED Treatments / Results  Labs (all labs ordered are listed, but only abnormal results are displayed) Labs Reviewed  BASIC METABOLIC PANEL - Abnormal; Notable for the following components:      Result Value   Chloride 100 (*)    Glucose, Bld 175 (*)    Creatinine, Ser 1.27 (*)    All other components within normal limits    BRAIN NATRIURETIC PEPTIDE - Abnormal; Notable for the following components:   B Natriuretic Peptide 610.3 (*)    All other components within normal limits  CBC WITH DIFFERENTIAL/PLATELET - Abnormal; Notable for the following components:   Hemoglobin 12.8 (*)    All other components within normal limits  TROPONIN I    EKG  EKG Interpretation  Date/Time:  Saturday January 22 2018 15:13:58 EST Ventricular Rate:  98 PR Interval:  156 QRS Duration: 72 QT Interval:  382 QTC Calculation: 487 R Axis:   57 Text Interpretation:  Normal sinus rhythm Nonspecific ST and T wave abnormality Abnormal ECG since last tracing no significant change Confirmed by Rolan BuccoBelfi, Dontray Haberland [phone]([id number]) on 01/22/2018 3:29:14 PM       Radiology  Dg Chest 2 View  Result Date: 01/22/2018 CLINICAL DATA:  Bilateral leg swelling.  Ex-smoker. EXAM: CHEST  2 VIEW COMPARISON:  12/25/2017. FINDINGS: Stable enlarged cardiac silhouette and mediastinal wires. Clear lungs with normal vascularity. Mild peribronchial thickening with mild improvement. Unremarkable bones. IMPRESSION: 1. No acute abnormality. 2. Stable cardiomegaly. 3. Mild chronic bronchitic changes with mild improvement. Electronically Signed   By: Beckie Salts M.D.   On: 01/22/2018 15:47    Procedures Procedures (including critical care time)  Medications Ordered in ED Medications  furosemide (LASIX) injection 80 mg (not administered)     Initial Impression / Assessment and Plan / ED Course  I have reviewed the triage vital signs and the nursing notes.  Pertinent labs & imaging results that were available during my care of the patient were reviewed by me and considered in my medical decision making (see chart for details).     Patient presents with peripheral edema.  There is no signs of pulmonary edema.  He has no shortness of breath or hypoxia.  There is no edema or vascular congestion on chest x-ray.  His labs are non-concerning.  His creatinine is actually  better than the last 2 times he had it checked at [facility].  I reviewed his old labs.  His potassium is normal.  He was given an extra dose of Lasix here in the emergency department.  I also advised him to use compression stockings to his legs given that the edema seems to be when he is up and walking.  I encouraged him to call his cardiologist on Monday for a follow-up appointment.  I did advise him that if he feels like he is fluid overloaded and the swelling is not getting better with elevation and compression stockings he can take periodically an extra dose of his Lasix.  He will need to have his labs rechecked by his cardiologist and he was notified of this.  Return precautions were given.  He did have Doppler studies of his lower extremities on his last visit at [facility] regional that were negative for DVT.  Final Clinical Impressions(s) / ED Diagnoses   Final diagnoses:  Peripheral edema    ED Discharge Orders    None       Rolan Bucco, MD 01/22/18 1740

## 2018-04-19 ENCOUNTER — Other Ambulatory Visit: Payer: Self-pay

## 2018-04-19 ENCOUNTER — Encounter (HOSPITAL_BASED_OUTPATIENT_CLINIC_OR_DEPARTMENT_OTHER): Payer: Self-pay | Admitting: Emergency Medicine

## 2018-04-19 ENCOUNTER — Emergency Department (HOSPITAL_BASED_OUTPATIENT_CLINIC_OR_DEPARTMENT_OTHER): Payer: BLUE CROSS/BLUE SHIELD

## 2018-04-19 ENCOUNTER — Emergency Department (HOSPITAL_BASED_OUTPATIENT_CLINIC_OR_DEPARTMENT_OTHER)
Admission: EM | Admit: 2018-04-19 | Discharge: 2018-04-19 | Disposition: A | Discharge status: Home / Self Care | Payer: BLUE CROSS/BLUE SHIELD | Attending: Emergency Medicine | Admitting: Emergency Medicine

## 2018-04-19 DIAGNOSIS — I110 Hypertensive heart disease with heart failure: Secondary | ICD-10-CM | POA: Diagnosis not present

## 2018-04-19 DIAGNOSIS — I509 Heart failure, unspecified: Secondary | ICD-10-CM | POA: Insufficient documentation

## 2018-04-19 DIAGNOSIS — Z87891 Personal history of nicotine dependence: Secondary | ICD-10-CM | POA: Diagnosis not present

## 2018-04-19 DIAGNOSIS — Z7982 Long term (current) use of aspirin: Secondary | ICD-10-CM | POA: Diagnosis not present

## 2018-04-19 DIAGNOSIS — Z7984 Long term (current) use of oral hypoglycemic drugs: Secondary | ICD-10-CM | POA: Insufficient documentation

## 2018-04-19 DIAGNOSIS — Z79899 Other long term (current) drug therapy: Secondary | ICD-10-CM | POA: Diagnosis not present

## 2018-04-19 DIAGNOSIS — E119 Type 2 diabetes mellitus without complications: Secondary | ICD-10-CM | POA: Diagnosis not present

## 2018-04-19 DIAGNOSIS — I2510 Atherosclerotic heart disease of native coronary artery without angina pectoris: Secondary | ICD-10-CM | POA: Insufficient documentation

## 2018-04-19 DIAGNOSIS — R0602 Shortness of breath: Secondary | ICD-10-CM | POA: Diagnosis not present

## 2018-04-19 DIAGNOSIS — Z8679 Personal history of other diseases of the circulatory system: Secondary | ICD-10-CM

## 2018-04-19 DIAGNOSIS — R059 Cough, unspecified: Secondary | ICD-10-CM

## 2018-04-19 DIAGNOSIS — R05 Cough: Secondary | ICD-10-CM | POA: Diagnosis not present

## 2018-04-19 HISTORY — DX: Heart failure, unspecified: I50.9

## 2018-04-19 LAB — CBC WITH DIFFERENTIAL/PLATELET
BASOS ABS: 0 10*3/uL (ref 0.0–0.1)
BASOS PCT: 0 %
EOS ABS: 0.1 10*3/uL (ref 0.0–0.7)
Eosinophils Relative: 1 %
HEMATOCRIT: 37.1 % — AB (ref 39.0–52.0)
HEMOGLOBIN: 12.2 g/dL — AB (ref 13.0–17.0)
Lymphocytes Relative: 18 %
Lymphs Abs: 1.1 10*3/uL (ref 0.7–4.0)
MCH: 27.5 pg (ref 26.0–34.0)
MCHC: 32.9 g/dL (ref 30.0–36.0)
MCV: 83.7 fL (ref 78.0–100.0)
Monocytes Absolute: 0.9 10*3/uL (ref 0.1–1.0)
Monocytes Relative: 15 %
NEUTROS ABS: 3.9 10*3/uL (ref 1.7–7.7)
NEUTROS PCT: 66 %
Platelets: 274 10*3/uL (ref 150–400)
RBC: 4.43 MIL/uL (ref 4.22–5.81)
RDW: 15.3 % (ref 11.5–15.5)
WBC: 5.9 10*3/uL (ref 4.0–10.5)

## 2018-04-19 LAB — BASIC METABOLIC PANEL
ANION GAP: 7 (ref 5–15)
BUN: 11 mg/dL (ref 6–20)
CHLORIDE: 101 mmol/L (ref 101–111)
CO2: 28 mmol/L (ref 22–32)
CREATININE: 1.06 mg/dL (ref 0.61–1.24)
Calcium: 8.6 mg/dL — ABNORMAL LOW (ref 8.9–10.3)
GFR calc non Af Amer: >60 mL/min (ref >60)
Glucose, Bld: 188 mg/dL — ABNORMAL HIGH (ref 65–99)
Potassium: 3.4 mmol/L — ABNORMAL LOW (ref 3.5–5.1)
Sodium: 136 mmol/L (ref 135–145)

## 2018-04-19 LAB — BRAIN NATRIURETIC PEPTIDE: B NATRIURETIC PEPTIDE 5: 731.9 pg/mL — AB (ref 0.0–100.0)

## 2018-04-19 LAB — TROPONIN I

## 2018-04-19 MED ORDER — IPRATROPIUM-ALBUTEROL 0.5-2.5 (3) MG/3ML IN SOLN
3.0000 mL | Freq: Once | RESPIRATORY_TRACT | Status: AC
  Administered 2018-04-19: 3 mL via RESPIRATORY_TRACT
  Filled 2018-04-19: qty 3

## 2018-04-19 MED ORDER — BENZONATATE 100 MG PO CAPS: 100.0000 mg | ORAL_CAPSULE | Freq: Three times a day (TID) | ORAL | 0 refills | Status: AC

## 2018-04-19 MED ORDER — ALBUTEROL SULFATE HFA 108 (90 BASE) MCG/ACT IN AERS
1.0000 | INHALATION_SPRAY | Freq: Four times a day (QID) | RESPIRATORY_TRACT | Status: DC | PRN
  Administered 2018-04-19: 1 via RESPIRATORY_TRACT
  Filled 2018-04-19 (×2): qty 6.7

## 2018-04-19 MED ORDER — FUROSEMIDE 20 MG PO TABS: 100.0000 mg | ORAL_TABLET | Freq: Two times a day (BID) | ORAL | 0 refills | Status: AC

## 2018-04-19 MED ORDER — FUROSEMIDE 10 MG/ML IJ SOLN
40.0000 mg | Freq: Once | INTRAMUSCULAR | Status: AC
  Administered 2018-04-19: 40 mg via INTRAVENOUS
  Filled 2018-04-19: qty 4

## 2018-04-19 MED FILL — BENZONATATE 100 MG CAP: 100 | 7 days supply | Qty: 21 | Fill #0

## 2018-04-19 NOTE — ED Triage Notes (Signed)
Productive cough x 3 weeks.  Pt denies fever, chills at the beginning.  Some nasal congestion/runny nose.  Some throat spasms after coughing.  Occasional sob.

## 2018-04-19 NOTE — ED Provider Notes (Signed)
[facility] EMERGENCY DEPARTMENT Provider Note   CSN: [id number] Arrival date & time: 04/19/18  0846     History   Chief Complaint Chief Complaint  Patient presents with  . Cough    HPI Oscar Shelton is a 47 y.o. male with a history of CHF, CAD, diabetes, hypertension who presents emergency department today for cough x 3 weeks.  Patient states that 2 weeks ago he started having nasal congestion, rhinorrhea, sneezing and a sore throat.  He notes shortly after he started having a productive cough with green/brown sputum.  He notes that his upper respiratory symptoms have subsided but he still has a ongoing productive cough.  He notes that this happens frequently and occurs more at night when he lies down.  He notes that he has associated shortness of breath and chest pain only when coughing.  He denies any shortness of breath at rest, with exertion or when lying down.  He denies any paroxysmal nocturnal dyspnea.  He denies any chest pain, nausea, diaphoresis with exertion.  No current chest pain.  He notes that he has been taking Mucinex and over-the-counter medications for his symptoms without relief.  Reports he has been taking his Lasix as prescribed.  He notes that he has some mild lower extremity swelling that he believes it is his baseline.  He notes his baseline weight is between 270.  Patient denies any exogenous estrogen/testosterone use, recent surgery or travel, trauma, immobilization, smoking, previous blood clot, hemoptysis, history of cancer, lower extremity pain, unilateral leg swelling or personal history of clotting disorder.  Patient's last echocardiogram (EF 25-30%) and heart cath (see below) during recent hospitalization on 03/11/2018 at [facility]. Patient is not on home O2.   Heart Cath - 03/11/18 1. Patent LIMA-LAD and SVG-PDA. 2. Patent circumflex. 3. All coronary territories appear well-perfused. 4. An LV gram was not performed due to renal insufficiency. 5.  Elevated RA/LV filling pressures consistent with salt/water overload. 6. Mildly reduced cardiac output, 4.7 L/min (CI 1.9). 7. Moderate pulmonary hypertension, but low pulmonary vascular resistance   HPI  Past Medical History:  Diagnosis Date  . CHF (congestive heart failure) (HCC)   . Coronary artery disease   . Diabetes mellitus without complication (HCC)   . Hypertension     Patient Active Problem List   Diagnosis Date Noted  . Multifocal pneumonia 11/24/2016  . Essential hypertension 06/11/2015  . Coronary artery disease involving native coronary artery of native heart without angina pectoris 05/17/2015  . Ischemic cardiomyopathy 05/17/2015  . Type 2 diabetes mellitus without complication, without long-term current use of insulin (HCC) 09/15/2013    Past Surgical History:  Procedure Laterality Date  . CORONARY ARTERY BYPASS GRAFT          Home Medications    Prior to Admission medications   Medication Sig Start Date End Date Taking? Authorizing Provider  atorvastatin (LIPITOR) 80 MG tablet Take 80 mg by mouth daily. 11/21/16  Yes [provider]  furosemide (LASIX) 20 MG tablet Take 3 tablets by mouth 2 (two) times daily. 03/21/18 03/21/19 Yes [provider]  sacubitril-valsartan (ENTRESTO) 24-26 MG Take 1 tablet by mouth 2 (two) times daily. 03/23/18 03/23/19 Yes [provider]  aspirin EC 81 MG tablet Take 1 tablet by mouth daily.    [provider]  carvedilol (COREG) 3.125 MG tablet Take 1 tablet by mouth 2 (two) times daily. 03/21/18   [provider]  gabapentin (NEURONTIN) 300 MG capsule Take  1 capsule by mouth 3 (three) times daily. 04/15/18   [provider]  glyBURIDE (DIABETA) 5 MG tablet Take 5 mg by mouth daily. 12/10/14   [provider]  losartan (COZAAR) 25 MG tablet Take 25 mg by mouth daily. 11/21/16   [provider]  metFORMIN (GLUCOPHAGE) 850 MG tablet Take 1 tablet by mouth 2 (two) times  daily. 04/09/18   [provider]  spironolactone (ALDACTONE) 25 MG tablet Take 1 tablet by mouth daily. 03/21/18   [provider]    Family History Family History  Problem Relation Age of Onset  . Hypertension Mother   . Diabetes Father   . Heart disease Father     Social History Social History   Tobacco Use  . Smoking status: Former Games developer  . Smokeless tobacco: Never Used  Substance Use Topics  . Alcohol use: Yes    Comment: beer occasional  . Drug use: No     Allergies   Patient has no known allergies.   Review of Systems Review of Systems  All other systems reviewed and are negative.    Physical Exam Updated Vital Signs BP 136/84 (BP Location: Right Arm)   Pulse 96   Temp 98.4 F (36.9 C) (Oral)   Resp (!) 24   Ht 6' (1.829 m)   Wt 124.7 kg (275 lb)   SpO2 96%   BMI 37.30 kg/m   Physical Exam  Constitutional: He appears well-developed and well-nourished.  HENT:  Head: Normocephalic and atraumatic.  Right Ear: External ear normal.  Left Ear: External ear normal.  Nose: Nose normal.  Mouth/Throat: Uvula is midline, oropharynx is clear and moist and mucous membranes are normal. No tonsillar exudate.  Eyes: Pupils are equal, round, and reactive to light. Right eye exhibits no discharge. Left eye exhibits no discharge. No scleral icterus.  Neck: Trachea normal. Neck supple. No JVD present. No spinous process tenderness present. Carotid bruit is not present. No neck rigidity. Normal range of motion present.  Cardiovascular: Normal rate, regular rhythm and intact distal pulses.  No murmur heard. Pulses:      Radial pulses are 2+ on the right side, and 2+ on the left side.       Dorsalis pedis pulses are 2+ on the right side, and 2+ on the left side.       Posterior tibial pulses are 2+ on the right side, and 2+ on the left side.  Patient with 1+ b/l lower extremity edema. Negative homan's test b/l.   Pulmonary/Chest: Effort normal. He has  wheezes (faint, end expiratory). He exhibits no tenderness.  No increased work of breathing. No accessory muscle use. Patient is sitting upright, speaking in full sentences without difficulty   Abdominal: Soft. Bowel sounds are normal. There is no tenderness. There is no rebound and no guarding.  Musculoskeletal: He exhibits no edema.  Lymphadenopathy:    He has no cervical adenopathy.  Neurological: He is alert.  Skin: Skin is warm and dry. No rash noted. He is not diaphoretic.  Psychiatric: He has a normal mood and affect.  Nursing note and vitals reviewed.    ED Treatments / Results  Labs (all labs ordered are listed, but only abnormal results are displayed) Labs Reviewed  CBC WITH DIFFERENTIAL/PLATELET - Abnormal; Notable for the following components:      Result Value   Hemoglobin 12.2 (*)    HCT 37.1 (*)    All other components within normal limits  BASIC  METABOLIC PANEL - Abnormal; Notable for the following components:   Potassium 3.4 (*)    Glucose, Bld 188 (*)    Calcium 8.6 (*)    All other components within normal limits  BRAIN NATRIURETIC PEPTIDE - Abnormal; Notable for the following components:   B Natriuretic Peptide 731.9 (*)    All other components within normal limits  TROPONIN I    EKG EKG Interpretation  Date/Time:  Tuesday April 19 2018 09:24:33 EDT Ventricular Rate:  92 PR Interval:    QRS Duration: 91 QT Interval:  398 QTC Calculation: 493 R Axis:   34 Text Interpretation:  Sinus rhythm Atrial premature complex Probable anterior infarct, age indeterminate Lateral leads are also involved No significant change was found Confirmed by Azalia Bilis ([id number]) on 04/19/2018 1:39:27 PM   Radiology Dg Chest 2 View  Result Date: 04/19/2018 CLINICAL DATA:  Three weeks of cough, shortness of breath, post-tussive chest discomfort. EXAM: CHEST - 2 VIEW COMPARISON:  PA and lateral chest x-ray of February 24, 2018 FINDINGS: The lungs are well-expanded. The  interstitial markings remain increased. The heart is top-normal in size. The pulmonary vascularity is mildly prominent centrally. The patient has undergone previous median sternotomy. The uppermost and lower most sternal wires are intact. The retrosternal soft tissues are normal. The bony thorax exhibits no acute abnormality. IMPRESSION: Stable appearance of the chest since the previous study. Probable low-grade compensated CHF. No acute pneumonia. Underlying COPD or reactive airway disease. Electronically Signed   By: David  [place] M.D.   On: 04/19/2018 09:55    Procedures Procedures (including critical care time)  Medications Ordered in ED Medications  albuterol (PROVENTIL HFA;VENTOLIN HFA) 108 (90 Base) MCG/ACT inhaler 1-2 puff (has no administration in time range)  ipratropium-albuterol (DUONEB) 0.5-2.5 (3) MG/3ML nebulizer solution 3 mL (3 mLs Nebulization Given 04/19/18 1215)  furosemide (LASIX) injection 40 mg (40 mg Intravenous Given 04/19/18 1240)     Initial Impression / Assessment and Plan / ED Course  I have reviewed the triage vital signs and the nursing notes.  Pertinent labs & imaging results that were available during my care of the patient were reviewed by me and considered in my medical decision making (see chart for details).     47 year old male with a history of CHF, CAD, diabetes and hypertension who presents emergency department today for cough x3 weeks.  Patient states that his cough began his upper respiratory symptoms have now subsided and he has a ongoing productive cough.  He notes that when he coughs he has shortness of breath and chest pain that subsides me in the abdomen.  He denies any chest pain or shortness of breath otherwise.  He denies any chest pain or shortness of breath that is brought on by exertion.  He notes that he has been compliant with his home medications including Lasix that is prescribed as 60 mg twice daily.  He notes that he has had mild  bilateral lower extremity swelling that he states is baseline.  His baseline weight is 278 he currently weighs 275.  He is not on home oxygen and is satting greater than 95% on room air without difficulty breathing in the department.  He had a reassuring heart catheterization on 03/11/2018 while admitted at [facility] (as outlined above in parentheses.  Patient does report that he has some increased salt intake over the weekend.  On exam patient's vital signs are reassuring.  No JVD noted on exam.  He does have some  faint expiratory wheezes that improved after DuoNeb treatment.  Will give albuterol inhaler in the department.  Chest x-ray shows stable appearance of prior study.  There is probable low-grade compensated CHF without underlying pneumonia.  BNP is elevated at 731.9.  Suspect patient's symptoms could be related to worsening CHF.  He was given IV Lasix in the department.  He he was ablated in the department and stayed greater than 95% on room air without complaints of chest pain or shortness of breath.  Feel he can be treated outpatient basis and will increase Lasix to 100 mg twice daily for the next 5-7 days with follow up with PCP Lynett Fish) and/or Cardiologist Tollie Pizza).  Recommended salt restriction.  Patient's work-up otherwise reassuring.  Troponin within normal limits.  No signficant changes on EKG.  No significant electrolyte derangements.  No AKI.  No anion gap acidosis.  No leukocytosis.  Anemia at baseline. Will advise albuterol as needed for cough and sob. Will also give tessalon. The evaluation does not show pathology that would require ongoing emergent intervention or inpatient treatment. I advised the patient to follow-up with PCP this week. I advised the patient to return to the emergency department with new or worsening symptoms or new concerns. Specific return precautions discussed. The patient verbalized understanding and agreement with plan. All questions answered. No further  questions at this time. The patient is hemodynamically stable, mentating appropriately and appears safe for discharge.  Final Clinical Impressions(s) / ED Diagnoses   Final diagnoses:  Cough  History of congestive heart failure  Shortness of breath    ED Discharge Orders        Ordered    benzonatate (TESSALON) 100 MG capsule  Every 8 hours     04/19/18 1344    furosemide (LASIX) 20 MG tablet  2 times daily     04/19/18 1344       Princella Pellegrini 04/19/18 1345    Azalia Bilis, MD 04/19/18 1455

## 2018-04-19 NOTE — ED Notes (Signed)
HR 100, RR 18, SPO2 95-100% ambulating here in the ED. PA notified.

## 2018-04-19 NOTE — Discharge Instructions (Signed)
Please increase your lasix to  twice daily over the next 5-7 days. Please follow up with your PCP and cardiologist in the next 7 days for re-evaluation. Take Tessalon as needed for cough. Use albuterol inhaler to help open up your airway. Use inhaler as follows: 1-2 puffs with spacer every 4 hours as needed for wheezing, cough, or shortness of breath. Please follow DASH diet (reduce salt intake). Please return for fever, chest pain, worsening shortness of breath or any other concerning symptoms.

## 2020-04-26 ENCOUNTER — Emergency Department (HOSPITAL_BASED_OUTPATIENT_CLINIC_OR_DEPARTMENT_OTHER)
Admission: EM | Admit: 2020-04-26 | Discharge: 2020-04-26 | Disposition: A | Discharge status: Home / Self Care | Payer: Medicare Other | Attending: Emergency Medicine | Admitting: Emergency Medicine

## 2020-04-26 ENCOUNTER — Other Ambulatory Visit: Payer: Self-pay

## 2020-04-26 DIAGNOSIS — Z7982 Long term (current) use of aspirin: Secondary | ICD-10-CM | POA: Diagnosis not present

## 2020-04-26 DIAGNOSIS — Z7984 Long term (current) use of oral hypoglycemic drugs: Secondary | ICD-10-CM | POA: Diagnosis not present

## 2020-04-26 DIAGNOSIS — I110 Hypertensive heart disease with heart failure: Secondary | ICD-10-CM | POA: Insufficient documentation

## 2020-04-26 DIAGNOSIS — Z87891 Personal history of nicotine dependence: Secondary | ICD-10-CM | POA: Diagnosis not present

## 2020-04-26 DIAGNOSIS — M25522 Pain in left elbow: Secondary | ICD-10-CM | POA: Diagnosis present

## 2020-04-26 DIAGNOSIS — M7022 Olecranon bursitis, left elbow: Secondary | ICD-10-CM | POA: Insufficient documentation

## 2020-04-26 DIAGNOSIS — Z79899 Other long term (current) drug therapy: Secondary | ICD-10-CM | POA: Diagnosis not present

## 2020-04-26 DIAGNOSIS — Z951 Presence of aortocoronary bypass graft: Secondary | ICD-10-CM | POA: Insufficient documentation

## 2020-04-26 DIAGNOSIS — Y939 Activity, unspecified: Secondary | ICD-10-CM | POA: Diagnosis not present

## 2020-04-26 DIAGNOSIS — I509 Heart failure, unspecified: Secondary | ICD-10-CM | POA: Diagnosis not present

## 2020-04-26 DIAGNOSIS — I2510 Atherosclerotic heart disease of native coronary artery without angina pectoris: Secondary | ICD-10-CM | POA: Insufficient documentation

## 2020-04-26 DIAGNOSIS — E119 Type 2 diabetes mellitus without complications: Secondary | ICD-10-CM | POA: Insufficient documentation

## 2020-04-26 NOTE — ED Triage Notes (Signed)
Pt notes swelling left elbow gradual onset beginning 3 days ago, awoke with pain and decreased ROM, no specific injury to area

## 2020-04-26 NOTE — ED Provider Notes (Signed)
[facility] Provider Note   CSN: [id number] Arrival date & time: 04/26/20  0854     History Chief Complaint  Patient presents with  . Arm Swelling    Oscar Shelton is a 49 y.o. male.  HPI  Patient is a 49 year old male with a history of CAD, DM, HTN, CHF presented today with left elbow pain and swelling that began 3 days ago and has been consistent since.  He states the pain is achy, worse with touch with no associated fevers, chills, nausea, vomiting.  He states that he is taking some Tylenol occasionally for his symptoms prior to arrival with modest improvement of pain.  Denies any mitigating factors.  Denies any history of septic bursitis, gout, CPPD, denies any trauma to the elbow pain     Past Medical History:  Diagnosis Date  . CHF (congestive heart failure) ([place])   . Coronary artery disease   . Diabetes mellitus without complication ([place])   . Hypertension     Patient Active Problem List   Diagnosis Date Noted  . Multifocal pneumonia 11/24/2016  . Essential hypertension 06/11/2015  . Coronary artery disease involving native coronary artery of native heart without angina pectoris 05/17/2015  . Ischemic cardiomyopathy 05/17/2015  . Type 2 diabetes mellitus without complication, without long-term current use of insulin ([place]) 09/15/2013    Past Surgical History:  Procedure Laterality Date  . CORONARY ARTERY BYPASS GRAFT         Family History  Problem Relation Age of Onset  . Hypertension Mother   . Diabetes Father   . Heart disease Father     Social History   Tobacco Use  . Smoking status: Former Research scientist (life sciences)  . Smokeless tobacco: Never Used  Substance Use Topics  . Alcohol use: Yes    Comment: beer occasional  . Drug use: No    Home Medications Prior to Admission medications   Medication Sig Start Date End Date Taking? Authorizing Provider  aspirin EC 81 MG tablet Take 1 tablet by mouth daily.    [provider]  atorvastatin (LIPITOR) 80 MG tablet Take 80 mg by mouth daily. 11/21/16   [provider]  benzonatate (TESSALON) 100 MG capsule Take 1 capsule (100 mg total) by mouth every 8 (eight) hours. 04/19/18   Maczis, Barth Kirks, PA-C  carvedilol (COREG) 3.125 MG tablet Take 1 tablet by mouth 2 (two) times daily. 03/21/18   [provider]  furosemide (LASIX) 20 MG tablet Take 3 tablets by mouth 2 (two) times daily. 03/21/18 03/21/19  [provider]  furosemide (LASIX) 20 MG tablet Take 5 tablets (100 mg total) by mouth 2 (two) times daily for 5 days. 04/19/18 04/24/18  Maczis, Barth Kirks, PA-C  gabapentin (NEURONTIN) 300 MG capsule Take 1 capsule by mouth 3 (three) times daily. 04/15/18   [provider]  glyBURIDE (DIABETA) 5 MG tablet Take 5 mg by mouth daily. 12/10/14   [provider]  losartan (COZAAR) 25 MG tablet Take 25 mg by mouth daily. 11/21/16   [provider]  metFORMIN (GLUCOPHAGE) 850 MG tablet Take 1 tablet by mouth 2 (two) times daily. 04/09/18   [provider]  spironolactone (ALDACTONE) 25 MG tablet Take 1 tablet by mouth daily. 03/21/18   [provider]    Allergies    Patient has no known allergies.  Review of Systems   Review of Systems  Constitutional: Negative for chills and fever.  HENT: Negative for  congestion.   Eyes: Negative for pain.  Gastrointestinal: Negative for abdominal pain and vomiting.  Genitourinary: Negative for dysuria.  Musculoskeletal: Negative for myalgias.       Left elbow pain  Skin: Negative for rash.  Neurological: Negative for dizziness and headaches.    Physical Exam Updated Vital Signs BP 122/79 (BP Location: Right Arm)   Pulse 77   Temp 98.4 F (36.9 C) (Oral)   Resp 16   Ht 6' (1.829 m)   Wt 114.3 kg   SpO2 99%   BMI 34.18 kg/m   Physical Exam Vitals and nursing note reviewed.  Constitutional:      General: He is not in acute distress.    Appearance: Normal  appearance. He is not ill-appearing.     Comments: Pleasant 49 year old male in no acute distress  HENT:     Head: Normocephalic and atraumatic.  Eyes:     General: No scleral icterus.       Right eye: No discharge.        Left eye: No discharge.     Conjunctiva/sclera: Conjunctivae normal.  Pulmonary:     Effort: Pulmonary effort is normal.     Breath sounds: No stridor.  Musculoskeletal:     Comments: Left elbow with tenderness to palpation of the posterior olecranon.  There is swelling just over the posterior elbow with no redness or warmth to touch.  Full range of motion of elbow.  Strength 5/5 in bilateral hands and elbows with flexion extension.  Neurological:     Mental Status: He is alert and oriented to person, place, and time. Mental status is at baseline.     ED Results / Procedures / Treatments   Labs (all labs ordered are listed, but only abnormal results are displayed) Labs Reviewed - No data to display  EKG None  Radiology No results found.  Procedures Procedures (including critical care time)  Medications Ordered in ED Medications - No data to display  ED Course  I have reviewed the triage vital signs and the nursing notes.  Pertinent labs & imaging results that were available during my care of the patient were reviewed by me and considered in my medical decision making (see chart for details).    MDM Rules/Calculators/A&P                      Patient is 49 year old male with history of HTN, DM, CHF and coronary artery disease presented today with planes of left elbow pain for 3 days.  He denies any systemic symptoms such as fevers, chills, nausea, malaise or fatigue.  He states that his only complaint is left elbow pain he states is worse with touch but he is able to range his elbow without pain voluntarily although he does have discomfort at the extremes of his range.  Physical exam is notable for swelling and some posterior elbow tenderness to  palpation.  This is consistent with olecranon bursitis.  No evidence of septic bursitis there is no trauma to indicate a fracture.  Plan is to discharge patient with conservative management, rice therapy, NSAID in the form of ibuprofen 600 mg 3 times daily.  And follow-up with Lasandra Beech of sports medicine.  I discussed this case with my attending physician who cosigned this note including patient's presenting symptoms, physical exam, and planned diagnostics and interventions. Attending physician stated agreement with plan or made changes to plan which were implemented.    Final Clinical Impression(s) /  ED Diagnoses Final diagnoses:  Olecranon bursitis of left elbow    Rx / DC Orders ED Discharge Orders    None       Gailen Shelter, [place] 04/26/20 0932    Maia Plan, MD 04/26/20 1050

## 2020-04-26 NOTE — Discharge Instructions (Addendum)
You have been diagnosed with olecranon bursitis/elbow bursitis.  Please use rice therapy-rest, ice, compression and elevation.  Please use ibuprofen for pain.  You may use 600 mg ibuprofen every 6 hours and you may also use 1000 mg of Tylenol every 6 hours.    Not to exceed 4 g of Tylenol within 24 hours.  Not to exceed 3200 mg ibuprofen 24 hours.  I have provided you with the phone number for Dr. Clare Gandy sports medicine who I recommend following up with.

## 2020-09-22 ENCOUNTER — Emergency Department (HOSPITAL_BASED_OUTPATIENT_CLINIC_OR_DEPARTMENT_OTHER): Payer: Medicare Other

## 2020-09-22 ENCOUNTER — Emergency Department (HOSPITAL_BASED_OUTPATIENT_CLINIC_OR_DEPARTMENT_OTHER)
Admission: EM | Admit: 2020-09-22 | Discharge: 2020-09-23 | Disposition: A | Discharge status: Home / Self Care | Payer: Medicare Other | Attending: Emergency Medicine | Admitting: Emergency Medicine

## 2020-09-22 ENCOUNTER — Other Ambulatory Visit: Payer: Self-pay

## 2020-09-22 DIAGNOSIS — I2510 Atherosclerotic heart disease of native coronary artery without angina pectoris: Secondary | ICD-10-CM | POA: Diagnosis not present

## 2020-09-22 DIAGNOSIS — Z7983 Long term (current) use of bisphosphonates: Secondary | ICD-10-CM | POA: Insufficient documentation

## 2020-09-22 DIAGNOSIS — Z87891 Personal history of nicotine dependence: Secondary | ICD-10-CM | POA: Diagnosis not present

## 2020-09-22 DIAGNOSIS — M25552 Pain in left hip: Secondary | ICD-10-CM | POA: Diagnosis present

## 2020-09-22 DIAGNOSIS — I10 Essential (primary) hypertension: Secondary | ICD-10-CM | POA: Diagnosis not present

## 2020-09-22 DIAGNOSIS — Z7982 Long term (current) use of aspirin: Secondary | ICD-10-CM | POA: Diagnosis not present

## 2020-09-22 DIAGNOSIS — Z79811 Long term (current) use of aromatase inhibitors: Secondary | ICD-10-CM | POA: Diagnosis not present

## 2020-09-22 DIAGNOSIS — E119 Type 2 diabetes mellitus without complications: Secondary | ICD-10-CM | POA: Insufficient documentation

## 2020-09-22 DIAGNOSIS — I5031 Acute diastolic (congestive) heart failure: Secondary | ICD-10-CM | POA: Insufficient documentation

## 2020-09-22 DIAGNOSIS — M79662 Pain in left lower leg: Secondary | ICD-10-CM | POA: Insufficient documentation

## 2020-09-22 DIAGNOSIS — R52 Pain, unspecified: Secondary | ICD-10-CM

## 2020-09-22 DIAGNOSIS — R1032 Left lower quadrant pain: Secondary | ICD-10-CM | POA: Insufficient documentation

## 2020-09-22 DIAGNOSIS — Z79899 Other long term (current) drug therapy: Secondary | ICD-10-CM | POA: Insufficient documentation

## 2020-09-22 MED ORDER — KETOROLAC TROMETHAMINE 30 MG/ML IJ SOLN
30.0000 mg | Freq: Once | INTRAMUSCULAR | Status: AC
  Administered 2020-09-22: 23:00:00 30 mg via INTRAMUSCULAR
  Filled 2020-09-22: qty 1

## 2020-09-22 MED ORDER — HYDROCODONE-ACETAMINOPHEN 5-325 MG PO TABS
1.0000 | ORAL_TABLET | Freq: Once | ORAL | Status: AC
  Administered 2020-09-22: 23:00:00 1 via ORAL
  Filled 2020-09-22: qty 1

## 2020-09-22 NOTE — ED Triage Notes (Signed)
Pt arrives pov,ambulatory to triage with c/o left hip pain, groin pain and  proximal LLE pain x 4 days. Pt denies injury. Pt states pain does not respond to medication

## 2020-09-23 MED ORDER — ACETAMINOPHEN 325 MG PO TABS
650.0000 mg | ORAL_TABLET | Freq: Once | ORAL | Status: AC
  Administered 2020-09-23: 02:00:00 650 mg via ORAL
  Filled 2020-09-23: qty 2

## 2020-09-23 MED ORDER — NAPROXEN 500 MG PO TABS: 500.0000 mg | ORAL_TABLET | Freq: Two times a day (BID) | ORAL | 0 refills | Status: AC

## 2020-09-23 MED ORDER — LIDOCAINE 5 % EX PTCH: 1.0000 | MEDICATED_PATCH | CUTANEOUS | 0 refills | Status: AC

## 2020-09-23 NOTE — ED Provider Notes (Signed)
[facility] EMERGENCY DEPARTMENT Provider Note   CSN: [id number] Arrival date & time: 09/22/20  1719     History Chief Complaint  Patient presents with  . Hip Pain    Oscar Shelton is a 49 y.o. male.  HPI     This is a 49 year old male with a history of CHF, coronary artery disease, diabetes, hypertension who presents with left hip pain.  Patient reports several day history of worsening left hip pain.  He states that the pain starts in his left hip and radiates into his anterior thigh.  Reports a patch of numbness over his anterior thigh.  He states pain is worse with ambulation.  He has taken naproxen and other over-the-counter medication without relief.  Denies back pain, weakness, bowel or bladder difficulty.  He denies injury.  Patient states that he did have some relief by lying on his left hip last night.  Currently rates his pain at 10 out of 10.  Denies fever or infectious symptoms.  Past Medical History:  Diagnosis Date  . CHF (congestive heart failure) (HCC)   . Coronary artery disease   . Diabetes mellitus without complication (HCC)   . Hypertension     Patient Active Problem List   Diagnosis Date Noted  . Multifocal pneumonia 11/24/2016  . Essential hypertension 06/11/2015  . Coronary artery disease involving native coronary artery of native heart without angina pectoris 05/17/2015  . Ischemic cardiomyopathy 05/17/2015  . Type 2 diabetes mellitus without complication, without long-term current use of insulin (HCC) 09/15/2013    Past Surgical History:  Procedure Laterality Date  . CORONARY ARTERY BYPASS GRAFT         Family History  Problem Relation Age of Onset  . Hypertension Mother   . Diabetes Father   . Heart disease Father     Social History   Tobacco Use  . Smoking status: Former Games developer  . Smokeless tobacco: Never Used  Vaping Use  . Vaping Use: Never used  Substance Use Topics  . Alcohol use: Yes    Comment: beer occasional   . Drug use: No    Home Medications Prior to Admission medications   Medication Sig Start Date End Date Taking? Authorizing Provider  aspirin EC 81 MG tablet Take 1 tablet by mouth daily.    [provider]  atorvastatin (LIPITOR) 80 MG tablet Take 80 mg by mouth daily. 11/21/16   [provider]  benzonatate (TESSALON) 100 MG capsule Take 1 capsule (100 mg total) by mouth every 8 (eight) hours. 04/19/18   Maczis, Elmer Sow, PA-C  carvedilol (COREG) 3.125 MG tablet Take 1 tablet by mouth 2 (two) times daily. 03/21/18   [provider]  furosemide (LASIX) 20 MG tablet Take 3 tablets by mouth 2 (two) times daily. 03/21/18 03/21/19  [provider]  furosemide (LASIX) 20 MG tablet Take 5 tablets (100 mg total) by mouth 2 (two) times daily for 5 days. 04/19/18 04/24/18  Maczis, Elmer Sow, PA-C  gabapentin (NEURONTIN) 300 MG capsule Take 1 capsule by mouth 3 (three) times daily. 04/15/18   [provider]  glyBURIDE (DIABETA) 5 MG tablet Take 5 mg by mouth daily. 12/10/14   [provider]  lidocaine (LIDODERM) 5 % Place 1 patch onto the skin daily. Remove & Discard patch within 12 hours or as directed by MD 09/23/20   Theodora Lalanne, Mayer Masker, MD  losartan (COZAAR) 25 MG tablet Take 25 mg by mouth daily. 11/21/16  [provider]  metFORMIN (GLUCOPHAGE) 850 MG tablet Take 1 tablet by mouth 2 (two) times daily. 04/09/18   [provider]  naproxen (NAPROSYN) 500 MG tablet Take 1 tablet (500 mg total) by mouth 2 (two) times daily. 09/23/20   Jaqulyn Chancellor, Mayer Masker, MD  spironolactone (ALDACTONE) 25 MG tablet Take 1 tablet by mouth daily. 03/21/18   [provider]    Allergies    Patient has no known allergies.  Review of Systems   Review of Systems  Constitutional: Negative for fever.  Musculoskeletal:       Left hip pain  Skin: Negative for color change and wound.  Neurological: Positive for numbness. Negative for weakness.  All other  systems reviewed and are negative.   Physical Exam Updated Vital Signs BP (!) 128/98   Pulse (!) 112   Temp 98.3 F (36.8 C)   Resp 18   Ht 1.829 m (6')   Wt 109.8 kg   SpO2 100%   BMI 32.82 kg/m   Physical Exam Vitals and nursing note reviewed.  Constitutional:      Appearance: He is well-developed.  HENT:     Head: Normocephalic and atraumatic.     Nose: Nose normal.  Eyes:     Pupils: Pupils are equal, round, and reactive to light.  Cardiovascular:     Rate and Rhythm: Normal rate and regular rhythm.     Heart sounds: Normal heart sounds.     Comments: Chest wall scarring noted Pulmonary:     Effort: Pulmonary effort is normal. No respiratory distress.     Breath sounds: Normal breath sounds.  Abdominal:     Palpations: Abdomen is soft.     Tenderness: There is no abdominal tenderness. There is no rebound.  Musculoskeletal:        General: Tenderness present. No deformity.     Cervical back: Neck supple.     Right lower leg: No edema.     Left lower leg: No edema.     Comments: Tenderness palpation of the greater trochanter of the left hip, no obvious deformity, normal passive and active range of motion, 2+ DP pulse distally  Lymphadenopathy:     Cervical: No cervical adenopathy.  Skin:    General: Skin is warm and dry.     Comments: No overlying skin changes  Neurological:     Mental Status: He is alert and oriented to person, place, and time.  Psychiatric:        Mood and Affect: Mood normal.     ED Results / Procedures / Treatments   Labs (all labs ordered are listed, but only abnormal results are displayed) Labs Reviewed - No data to display  EKG None  Radiology DG HIP UNILAT WITH PELVIS 2-3 VIEWS LEFT  Result Date: 09/23/2020 CLINICAL DATA:  Left hip pain EXAM: DG HIP (WITH OR WITHOUT PELVIS) 2-3V LEFT COMPARISON:  None. FINDINGS: There is no evidence of hip fracture or dislocation. Moderate bilateral hip osteoarthritis is seen with superior  joint space loss and marginal osteophyte formation. No focal soft tissue swelling. IMPRESSION: No acute osseous abnormality. Moderate bilateral hip osteoarthritis. Electronically Signed   By: Jonna Clark M.D.   On: 09/23/2020 00:03    Procedures Procedures (including critical care time)  Medications Ordered in ED Medications  HYDROcodone-acetaminophen (NORCO/VICODIN) 5-325 MG per tablet 1 tablet (1 tablet Oral Given 09/22/20 2317)  ketorolac (TORADOL) 30 MG/ML injection 30 mg (30 mg Intramuscular Given 09/22/20 2318)  ED Course  I have reviewed the triage vital signs and the nursing notes.  Pertinent labs & imaging results that were available during my care of the patient were reviewed by me and considered in my medical decision making (see chart for details).    MDM Rules/Calculators/A&P                           Patient presents with left hip pain.  He is overall nontoxic and vital signs are reassuring.  He is afebrile.  Denies significant injury.  Tenderness is at the hip joint and not the back.  I have low suspicion for sciatica or other low back etiology.  Bursitis would be consideration although his pain seemed to improve with sleeping on that side.  He has normal active and passive range of motion without infectious symptoms which would argue against a septic joint.  X-rays are notable for some osteoarthritis.  This could be the culprit.  Patient had some limited improvement with Toradol and Norco.  Given his medical problems, recommend ongoing over-the-counter medications and will provide with a Lidoderm patch.  Follow-up with primary physician recommended.  After history, exam, and medical workup I feel the patient has been appropriately medically screened and is safe for discharge home. Pertinent diagnoses were discussed with the patient. Patient was given return precautions.   Final Clinical Impression(s) / ED Diagnoses Final diagnoses:  Left hip pain    Rx / DC Orders ED  Discharge Orders         Ordered    naproxen (NAPROSYN) 500 MG tablet  2 times daily        09/23/20 0126    lidocaine (LIDODERM) 5 %  Every 24 hours        09/23/20 0126           Shon Baton, MD 09/23/20 [phone]

## 2020-09-23 NOTE — Discharge Instructions (Addendum)
You were seen today for hip pain.  Your x-ray shows some osteoarthritis.  Often times anti-inflammatory medications or Tylenol are appropriate for this.  Given your ongoing discomfort, you will be given Lidoderm patches for pain.  Follow-up closely with your primary physician.

## 2020-12-05 ENCOUNTER — Emergency Department (HOSPITAL_BASED_OUTPATIENT_CLINIC_OR_DEPARTMENT_OTHER): Payer: Medicare Other

## 2020-12-05 ENCOUNTER — Other Ambulatory Visit: Payer: Self-pay

## 2020-12-05 ENCOUNTER — Emergency Department (HOSPITAL_BASED_OUTPATIENT_CLINIC_OR_DEPARTMENT_OTHER)
Admission: EM | Admit: 2020-12-05 | Discharge: 2020-12-05 | Disposition: A | Discharge status: Home / Self Care | Payer: Medicare Other | Attending: Emergency Medicine | Admitting: Emergency Medicine

## 2020-12-05 ENCOUNTER — Encounter (HOSPITAL_BASED_OUTPATIENT_CLINIC_OR_DEPARTMENT_OTHER): Payer: Self-pay

## 2020-12-05 DIAGNOSIS — I110 Hypertensive heart disease with heart failure: Secondary | ICD-10-CM | POA: Diagnosis not present

## 2020-12-05 DIAGNOSIS — R748 Abnormal levels of other serum enzymes: Secondary | ICD-10-CM | POA: Diagnosis not present

## 2020-12-05 DIAGNOSIS — I2510 Atherosclerotic heart disease of native coronary artery without angina pectoris: Secondary | ICD-10-CM | POA: Diagnosis not present

## 2020-12-05 DIAGNOSIS — M25512 Pain in left shoulder: Secondary | ICD-10-CM

## 2020-12-05 DIAGNOSIS — Z951 Presence of aortocoronary bypass graft: Secondary | ICD-10-CM | POA: Insufficient documentation

## 2020-12-05 DIAGNOSIS — M542 Cervicalgia: Secondary | ICD-10-CM | POA: Diagnosis present

## 2020-12-05 DIAGNOSIS — Z7982 Long term (current) use of aspirin: Secondary | ICD-10-CM | POA: Insufficient documentation

## 2020-12-05 DIAGNOSIS — M25532 Pain in left wrist: Secondary | ICD-10-CM | POA: Diagnosis not present

## 2020-12-05 DIAGNOSIS — Z79899 Other long term (current) drug therapy: Secondary | ICD-10-CM | POA: Insufficient documentation

## 2020-12-05 DIAGNOSIS — R52 Pain, unspecified: Secondary | ICD-10-CM

## 2020-12-05 DIAGNOSIS — R000 Tachycardia, unspecified: Secondary | ICD-10-CM | POA: Insufficient documentation

## 2020-12-05 DIAGNOSIS — Z87891 Personal history of nicotine dependence: Secondary | ICD-10-CM | POA: Diagnosis not present

## 2020-12-05 DIAGNOSIS — I509 Heart failure, unspecified: Secondary | ICD-10-CM | POA: Diagnosis not present

## 2020-12-05 DIAGNOSIS — E876 Hypokalemia: Secondary | ICD-10-CM | POA: Diagnosis not present

## 2020-12-05 DIAGNOSIS — Z7984 Long term (current) use of oral hypoglycemic drugs: Secondary | ICD-10-CM | POA: Diagnosis not present

## 2020-12-05 DIAGNOSIS — M79642 Pain in left hand: Secondary | ICD-10-CM

## 2020-12-05 DIAGNOSIS — E119 Type 2 diabetes mellitus without complications: Secondary | ICD-10-CM | POA: Insufficient documentation

## 2020-12-05 DIAGNOSIS — M19012 Primary osteoarthritis, left shoulder: Secondary | ICD-10-CM | POA: Insufficient documentation

## 2020-12-05 DIAGNOSIS — M7989 Other specified soft tissue disorders: Secondary | ICD-10-CM | POA: Insufficient documentation

## 2020-12-05 DIAGNOSIS — M9961 Osseous and subluxation stenosis of intervertebral foramina of cervical region: Secondary | ICD-10-CM | POA: Insufficient documentation

## 2020-12-05 LAB — COMPREHENSIVE METABOLIC PANEL
ALT: 12 U/L (ref 0–44)
AST: 13 U/L — ABNORMAL LOW (ref 15–41)
Albumin: 3.4 g/dL — ABNORMAL LOW (ref 3.5–5.0)
Alkaline Phosphatase: 151 U/L — ABNORMAL HIGH (ref 38–126)
Anion gap: 13 (ref 5–15)
BUN: 17 mg/dL (ref 6–20)
CO2: 28 mmol/L (ref 22–32)
Calcium: 8.7 mg/dL — ABNORMAL LOW (ref 8.9–10.3)
Chloride: 98 mmol/L (ref 98–111)
Creatinine, Ser: 0.85 mg/dL (ref 0.61–1.24)
GFR, Estimated: >60 mL/min (ref >60)
Glucose, Bld: 145 mg/dL — ABNORMAL HIGH (ref 70–99)
Potassium: 3.1 mmol/L — ABNORMAL LOW (ref 3.5–5.1)
Sodium: 139 mmol/L (ref 135–145)
Total Bilirubin: 1.4 mg/dL — ABNORMAL HIGH (ref 0.3–1.2)
Total Protein: 7.3 g/dL (ref 6.5–8.1)

## 2020-12-05 LAB — CBC WITH DIFFERENTIAL/PLATELET
Abs Immature Granulocytes: 0.04 10*3/uL (ref 0.00–0.07)
Basophils Absolute: 0 10*3/uL (ref 0.0–0.1)
Basophils Relative: 1 %
Eosinophils Absolute: 0.1 10*3/uL (ref 0.0–0.5)
Eosinophils Relative: 1 %
HCT: 37.3 % — ABNORMAL LOW (ref 39.0–52.0)
Hemoglobin: 12.3 g/dL — ABNORMAL LOW (ref 13.0–17.0)
Immature Granulocytes: 1 %
Lymphocytes Relative: 10 %
Lymphs Abs: 0.9 10*3/uL (ref 0.7–4.0)
MCH: 28.9 pg (ref 26.0–34.0)
MCHC: 33 g/dL (ref 30.0–36.0)
MCV: 87.6 fL (ref 80.0–100.0)
Monocytes Absolute: 1.3 10*3/uL — ABNORMAL HIGH (ref 0.1–1.0)
Monocytes Relative: 16 %
Neutro Abs: 5.9 10*3/uL (ref 1.7–7.7)
Neutrophils Relative %: 71 %
Platelets: 274 10*3/uL (ref 150–400)
RBC: 4.26 MIL/uL (ref 4.22–5.81)
RDW: 15.4 % (ref 11.5–15.5)
WBC: 8.2 10*3/uL (ref 4.0–10.5)
nRBC: 0 % (ref 0.0–0.2)

## 2020-12-05 MED ORDER — KETOROLAC TROMETHAMINE 30 MG/ML IJ SOLN
30.0000 mg | Freq: Once | INTRAMUSCULAR | Status: AC
  Administered 2020-12-05: 17:00:00 30 mg via INTRAVENOUS
  Filled 2020-12-05: qty 1

## 2020-12-05 MED ORDER — METHOCARBAMOL 500 MG PO TABS: 500.0000 mg | ORAL_TABLET | Freq: Three times a day (TID) | ORAL | 0 refills | Status: AC | PRN

## 2020-12-05 MED ORDER — METHOCARBAMOL 1000 MG/10ML IJ SOLN
1000.0000 mg | Freq: Once | INTRAVENOUS | Status: DC
  Filled 2020-12-05: qty 10

## 2020-12-05 MED ORDER — MORPHINE SULFATE (PF) 4 MG/ML IV SOLN
4.0000 mg | Freq: Once | INTRAVENOUS | Status: AC
  Administered 2020-12-05: 18:00:00 4 mg via INTRAVENOUS
  Filled 2020-12-05: qty 1

## 2020-12-05 MED ORDER — METHOCARBAMOL 500 MG PO TABS
1000.0000 mg | ORAL_TABLET | Freq: Once | ORAL | Status: AC
  Administered 2020-12-05: 17:00:00 1000 mg via ORAL
  Filled 2020-12-05: qty 2

## 2020-12-05 NOTE — ED Triage Notes (Addendum)
Pt c/o pain to posterior neck and left UE with swelling to left hand-sx started yesterday-denies injury to neck or UE-states pain is much worse when he moves and attempts to lift left arm-NAD-slow gait

## 2020-12-05 NOTE — ED Provider Notes (Signed)
[facility] EMERGENCY DEPARTMENT Provider Note   CSN: [id number] Arrival date & time: 12/05/20  1450     History Chief Complaint  Patient presents with  . Neck Pain    Oscar Shelton is a 49 y.o. male CHF (on digoxin), CAD (s/p CABG) diabetes, hypertension. Patient is with a chief complaint of neck pain, left shoulder pain, left wrist and hand pain and swelling to his left hand and wrist. Patient reports that his shoulder pain began yesterday. He denies any falls or injuries. Patient had a colonoscopy performed yesterday he received anesthesia, pain preceded this event he denies any IV access to his left arm. Patient reports that the pain in his shoulder was worse when he woke up this morning. Patient also reports that  Today he started having neck pain and left hand/wrist swelling, redness and weakness. Patient states he is more comfortable with his neck in a flexed position and when he lifts his neck to neutral or extended position he experiences pain that moves down his left arm. Patient endorses numbness and tingling in his left hand. Patient denies any fevers, chills, back pain, difficulty walking, asymmetry, headache, syncope, seizure, lightheadedness, dizziness, or bladder dysfunction, shortness of breath, chest pain.  Denies any active cancer, long periods of immobilization, hormone therapy, or use DVT or PE, recent surgery.  Patient denies any tobacco or illicit drug use. Patient endorses occasional alcohol use.   Patient is right hand dominant.    HPI     Past Medical History:  Diagnosis Date  . CHF (congestive heart failure) ([place])   . Coronary artery disease   . Diabetes mellitus without complication ([place])   . Hypertension     Patient Active Problem List   Diagnosis Date Noted  . Multifocal pneumonia 11/24/2016  . Essential hypertension 06/11/2015  . Coronary artery disease involving native coronary artery of native heart without angina pectoris 05/17/2015   . Ischemic cardiomyopathy 05/17/2015  . Type 2 diabetes mellitus without complication, without long-term current use of insulin ([place]) 09/15/2013    Past Surgical History:  Procedure Laterality Date  . CORONARY ARTERY BYPASS GRAFT         Family History  Problem Relation Age of Onset  . Hypertension Mother   . Diabetes Father   . Heart disease Father     Social History   Tobacco Use  . Smoking status: Former Research scientist (life sciences)  . Smokeless tobacco: Never Used  Vaping Use  . Vaping Use: Never used  Substance Use Topics  . Alcohol use: Yes    Comment: occ  . Drug use: No    Home Medications Prior to Admission medications   Medication Sig Start Date End Date Taking? Authorizing Provider  aspirin EC 81 MG tablet Take 1 tablet by mouth daily.    [provider]  atorvastatin (LIPITOR) 80 MG tablet Take 80 mg by mouth daily. 11/21/16   [provider]  benzonatate (TESSALON) 100 MG capsule Take 1 capsule (100 mg total) by mouth every 8 (eight) hours. 04/19/18   Maczis, Barth Kirks, PA-C  carvedilol (COREG) 3.125 MG tablet Take 1 tablet by mouth 2 (two) times daily. 03/21/18   [provider]  furosemide (LASIX) 20 MG tablet Take 3 tablets by mouth 2 (two) times daily. 03/21/18 03/21/19  [provider]  furosemide (LASIX) 20 MG tablet Take 5 tablets (100 mg total) by mouth 2 (two) times daily for 5 days. 04/19/18 04/24/18  Maczis, Barth Kirks, PA-C  gabapentin (  NEURONTIN) 300 MG capsule Take 1 capsule by mouth 3 (three) times daily. 04/15/18   [provider]  glyBURIDE (DIABETA) 5 MG tablet Take 5 mg by mouth daily. 12/10/14   [provider]  lidocaine (LIDODERM) 5 % Place 1 patch onto the skin daily. Remove & Discard patch within 12 hours or as directed by MD 09/23/20   Horton, Mayer Masker, MD  losartan (COZAAR) 25 MG tablet Take 25 mg by mouth daily. 11/21/16   [provider]  metFORMIN (GLUCOPHAGE) 850 MG tablet Take 1 tablet by mouth 2  (two) times daily. 04/09/18   [provider]  methocarbamol (ROBAXIN) 500 MG tablet Take 1 tablet (500 mg total) by mouth every 8 (eight) hours as needed for muscle spasms. 12/05/20   Haskel Schroeder, PA-C  naproxen (NAPROSYN) 500 MG tablet Take 1 tablet (500 mg total) by mouth 2 (two) times daily. 09/23/20   Horton, Mayer Masker, MD  spironolactone (ALDACTONE) 25 MG tablet Take 1 tablet by mouth daily. 03/21/18   [provider]    Allergies    Patient has no known allergies.  Review of Systems   Review of Systems  Constitutional: Negative for chills and fever.  Eyes: Negative for visual disturbance.  Respiratory: Negative for shortness of breath.   Cardiovascular: Negative for chest pain and palpitations.  Gastrointestinal: Negative for abdominal pain, nausea and vomiting.  Genitourinary: Negative for difficulty urinating and dysuria.  Musculoskeletal: Positive for neck pain. Negative for back pain.  Skin: Negative for color change and rash.  Neurological: Positive for weakness (left hand and wrist) and numbness (left hand). Negative for dizziness, tremors, seizures, syncope, facial asymmetry, speech difficulty, light-headedness and headaches.  Psychiatric/Behavioral: Negative for confusion.    Physical Exam Updated Vital Signs BP (!) 142/102 (BP Location: Left Arm)   Pulse (!) 104   Temp 99.7 F (37.6 C) (Oral)   Resp 20   Ht 6' (1.829 m)   Wt 106.1 kg   SpO2 99%   BMI 31.74 kg/m   Physical Exam Vitals and nursing note reviewed.  Constitutional:      General: He is not in acute distress.    Appearance: He is diaphoretic. He is not ill-appearing or toxic-appearing.  HENT:     Head: Normocephalic and atraumatic. No raccoon eyes or Battle's sign.  Eyes:     General: Vision grossly intact. No scleral icterus.       Right eye: No discharge.        Left eye: No discharge.     Extraocular Movements: Extraocular movements intact.     Conjunctiva/sclera:  Conjunctivae normal.     Right eye: No hemorrhage.    Left eye: No hemorrhage.    Pupils: Pupils are equal, round, and reactive to light.  Cardiovascular:     Rate and Rhythm: Regular rhythm. Tachycardia present.     Pulses:          Radial pulses are 3+ on the left side.     Heart sounds: Normal heart sounds.  Pulmonary:     Effort: Pulmonary effort is normal.     Breath sounds: Normal breath sounds.  Abdominal:     General: There is no distension.     Palpations: Abdomen is soft.     Tenderness: There is no abdominal tenderness.     Comments: No seatbelt sign   Musculoskeletal:     Left shoulder: Tenderness present. No swelling or deformity. Decreased range of motion (due  to pain). Decreased strength.     Left upper arm: No swelling, edema, deformity, tenderness or bony tenderness.     Left elbow: No swelling, deformity or effusion. Normal range of motion. No tenderness.     Left forearm: Tenderness present. No swelling, deformity or bony tenderness.     Right wrist: No swelling, deformity, tenderness, bony tenderness or snuff box tenderness. Normal pulse.     Left wrist: Swelling and tenderness present. No deformity, bony tenderness or snuff box tenderness. Normal pulse.     Left hand: Swelling and tenderness present. No deformity or bony tenderness. Decreased range of motion. Decreased strength of wrist extension. Normal sensation.     Cervical back: Neck supple. No edema, deformity, erythema, rigidity, torticollis or crepitus. Pain with movement and muscular tenderness (left trapezius) present. No spinous process tenderness. Decreased range of motion (due to complaints of pain).     Thoracic back: No deformity or bony tenderness.     Lumbar back: No deformity or bony tenderness.  Skin:    General: Skin is warm.     Findings: No bruising.  Neurological:     General: No focal deficit present.     Mental Status: He is alert.     GCS: GCS eye subscore is 4. GCS verbal subscore is  5. GCS motor subscore is 6.     Cranial Nerves: No cranial nerve deficit or facial asymmetry.     Sensory: Sensation is intact.     Motor: No weakness, tremor, seizure activity or pronator drift.     Coordination: Romberg sign negative. Finger-Nose-Finger Test normal.     Gait: Gait is intact. Gait normal.     Comments: CN II-XII intact, +5 strength to upper and lower extremities, equal grip strength    Psychiatric:        Behavior: Behavior is cooperative.     ED Results / Procedures / Treatments   Labs (all labs ordered are listed, but only abnormal results are displayed) Labs Reviewed  COMPREHENSIVE METABOLIC PANEL - Abnormal; Notable for the following components:      Result Value   Potassium 3.1 (*)    Glucose, Bld 145 (*)    Calcium 8.7 (*)    Albumin 3.4 (*)    AST 13 (*)    Alkaline Phosphatase 151 (*)    Total Bilirubin 1.4 (*)    All other components within normal limits  CBC WITH DIFFERENTIAL/PLATELET - Abnormal; Notable for the following components:   Hemoglobin 12.3 (*)    HCT 37.3 (*)    Monocytes Absolute 1.3 (*)    All other components within normal limits    EKG None  Radiology DG Chest 2 View  Result Date: 12/05/2020 CLINICAL DATA:  Left shoulder and arm pain. Pain since yesterday. Rule out mass. EXAM: CHEST - 2 VIEW COMPARISON:  Radiograph 10/01/2020. Most recent chest CT 10/17/2018 FINDINGS: Post median sternotomy.The cardiomediastinal contours are stable with mild cardiomegaly. There is no evidence of pulmonary mass. Pulmonary vasculature is normal. No consolidation, pleural effusion, or pneumothorax. No acute osseous abnormalities are seen. No evidence of focal bone lesion. IMPRESSION: 1. No acute chest findings.  No evidence of pulmonary mass. 2. Post median sternotomy with mild cardiomegaly. Electronically Signed   By: Narda Rutherford M.D.   On: 12/05/2020 18:11   CT Cervical Spine Wo Contrast  Result Date: 12/05/2020 CLINICAL DATA:  Cervical  radiculopathy, posterior neck pain and left shoulder pain X 2 days. Left upper extremity  swelling and weakness. EXAM: CT CERVICAL SPINE WITHOUT CONTRAST TECHNIQUE: Multidetector CT imaging of the cervical spine was performed without intravenous contrast. Multiplanar CT image reconstructions were also generated. COMPARISON:  X-ray cervical spine 05/18/2009 report without images. FINDINGS: Alignment: Reversal of the normal cervical lordosis likely due to positioning and degenerative changes. Skull base and vertebrae: Multilevel degenerative changes of the spine with osteophyte formation, facet arthropathy, uncovertebral Ross throb a thecal that are worse at the C3 through C7 levels with associated at least moderate multilevel osseous neural foraminal stenosis. No severe central canal stenosis. no acute fracture. No aggressive appearing focal osseous lesion or focal pathologic process. Soft tissues and spinal canal: No prevertebral fluid or swelling. No visible canal hematoma. Disc levels: Multilevel moderate to severe intervertebral disc space narrowing. Upper chest: Unremarkable. Other: Sternotomy wires partially visualized. 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 Electronically Signed   By: Iven Finn M.D.   On: 12/05/2020 17:46   DG Shoulder Left  Result Date: 12/05/2020 CLINICAL DATA:  Left shoulder and arm pain. Rule out mass. EXAM: LEFT SHOULDER - 2+ VIEW COMPARISON:  None. FINDINGS: There is no evidence of fracture or dislocation. Normal alignment. Mild inferior glenohumeral spurring. Mild acromioclavicular spurring with inferiorly directed osteophytes. No evidence of focal lesion or bony destruction. Soft tissues are unremarkable. IMPRESSION: Mild acromioclavicular and glenohumeral osteoarthritis. No evidence of focal osseous lesion.  Electronically Signed   By: Keith Rake M.D.   On: 12/05/2020 18:12    Procedures Procedures (including critical care time)  Medications Ordered in ED Medications  ketorolac (TORADOL) 30 MG/ML injection 30 mg (30 mg Intravenous Given 12/05/20 1717)  methocarbamol (ROBAXIN) tablet 1,000 mg (1,000 mg Oral Given 12/05/20 1716)  morphine 4 MG/ML injection 4 mg (4 mg Intravenous Given 12/05/20 1758)    ED Course  I have reviewed the triage vital signs and the nursing notes.  Pertinent labs & imaging results that were available during my care of the patient were reviewed by me and considered in my medical decision making (see chart for details).    MDM Rules/Calculators/A&P                          Alert 49 year old male in no acute distress and in no acute distress.  On physical examination patient has no spinal step-off or tenderness to spinous process.  Patient has tenderness to palpation of left trapezius.  Patient is able to lift and extend his head with complaints of pain.  CN II-XII intact +5 strength to right arm and bilateral lower legs.  Patient complains of pain on range of motion of left shoulder and left wrist and hand with limited examination.  Patient has weakness to his left grip strength, unclear if this is due to pain.    Patient symptoms seem consistent with radiculopathy.  Unable to obtain an MRI at this facility at this time, therefore cervical CT was ordered.  Will have patient follow-up with neurosurgery outpatient to have MRI performed.  Meningitis was considered however patient is afebrile, nontoxic, has no leukocytosis.  ACS was considered however unlikely as patient has no chest pain, nausea, vomiting, or diaphoresis and patient's pain is reproducible with palpation and movement.   DVT of left arm was considered however patient is low risk for this with no active cancer, recent immobilization, recent  surgery, swelling greater than 3 cm, venous DVT or PE.  Ultrasound  was unavailable at this time, will have patient follow-up with [facility] tomorrow to have ultrasound study done on his left arm.  CMP showed potassium lower at 3.1, as well as alk phos and total bilirubin slightly elevated.  These changes likely reflect decreased p.o. oral intake and some dehydration as a result of patient's prep for colonoscopy.  On reexamination patient reports improved pain, he has increased range of motion to his left wrist and left shoulder.     CT scan showed no acute displaced fracture or traumatic listhesis of the cervical spine; multilevel degenerative changes with at least moderate bilateral osseous neural foraminal stenosis.  X-ray of left shoulder showed mild acromioclavicular and lateral humeral osteoarthritis.  No evidence of focal osseous lesion, no evidence of fracture or dislocation.  Chest x-ray showed no acute chest findings, no evidence of pulmonary mass, post median sternotomy with mild cardiomegaly.  Patient will follow up with primary care for repeat lab work, patient will follow up with neurosurgery for MRI, patient will return to [facility] tomorrow for ultrasound DVT study.  Discussed results, findings, treatment and follow up. Patient advised of return precautions. Patient verbalized understanding and agreed with plan.  Patient was discussed with and evaluated by Dr. Roslynn Amble.   Final Clinical Impression(s) / ED Diagnoses Final diagnoses:  Neck pain  Acute pain of left shoulder  Acute pain of left wrist  Left hand pain    Rx / DC Orders ED Discharge Orders         Ordered    US Venous Img Upper Uni Left        12/05/20 1837    methocarbamol (ROBAXIN) 500 MG tablet  Every 8 hours PRN        12/05/20 1837           Loni Beckwith, PA-C 12/05/20 2219    Lucrezia Starch, MD 12/07/20 1210

## 2020-12-05 NOTE — Discharge Instructions (Addendum)
You came to the emergency department today to be evaluated for your neck pain, left shoulder pain, left wrist and hand pain.  CT scan of your neck showed no acute injuries; it did show degenerative changes with moderate bilateral osseous neural foraminal stenosis.  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  The chest x-ray showed no acute findings.  As we discussed your symptoms are likely related to cervical radiculopathy which means that a nerve in the neck is pinched or bruised.  Due to this we will have you follow-up with neurosurgery to receive an MRI in the outpatient setting.  While less likely your left arm pain could be caused by a blood clot.  Therefore you will need to return to med [facility] tomorrow.  Please call [phone] with to schedule your appointment.    Your lab work showed that your potassium was slightly lowered this is likely due to your preparation and fasting for your colonoscopy.  I have attached information sheet about the fasting levels of various foods.  Please make sure to hydrate and eat after your discharge.  I will have you follow-up with your primary care provider to recheck your labs.    Today you were prescribed Methocarbamol (Robaxin).  Methocarbamol (Robaxin) is used to treat muscle spasms/pain.  It works by helping to relax the muscles.  Drowsiness, dizziness, lightheadedness, stomach upset, nausea/vomiting, or blurred vision may occur.  Do not drive, use machinery, or do anything that needs alertness or clear vision until you can do it safely.  Do not combine this medication with alcoholic beverages, marijuana, or other central nervous system depressants.     Please take Ibuprofen (Advil, motrin) and Tylenol (acetaminophen) to relieve your pain.  You may take up to 600 MG (3 pills) of normal strength ibuprofen every 8 hours as needed.  In between doses of ibuprofen you make take tylenol, up to 1,000 mg (two extra  strength pills).  Do not take more than 3,000 mg tylenol in a 24 hour period.  Please check all medication labels as many medications such as pain and cold medications may contain tylenol.  Do not drink alcohol while taking these medications.  Do not take other NSAID'S while taking ibuprofen (such as aleve or naproxen).  Please take ibuprofen with food to decrease stomach upset.  Today you received medications that may make you sleepy or impair your ability to make decisions.  For the next 24 hours please do not drive, operate heavy machinery, care for a small child with out another adult present, or perform any activities that may cause harm to you or someone else if you were to fall asleep or be impaired.   Please return to the emergency department if:   Your pain gets much worse and cannot be controlled with medicines. You have weakness or numbness in your hand, arm, face, or leg. You have a high fever. You have a stiff, rigid neck. You lose control of your bowels or your bladder (have incontinence). You have trouble with walking, balance, or speaking.

## 2020-12-05 NOTE — ED Notes (Signed)
Patient reports neck and shoulder pain since yesterday, awoke this morning with tenderness and swelling to left distal forearm and hand.

## 2020-12-05 NOTE — ED Provider Notes (Signed)
  Physical Exam  BP 134/90 (BP Location: Right Arm)   Pulse (!) 117   Temp 98.3 F (36.8 C) (Oral)   Resp 18   Ht 6' (1.829 m)   Wt 106.1 kg   SpO2 98%   BMI 31.74 kg/m   Physical Exam Vitals and nursing note reviewed.  Constitutional:      General: He is not in acute distress.    Appearance: He is well-developed and well-nourished.     Comments: Appears nontoxic  HENT:     Head: Normocephalic and atraumatic.  Eyes:     Extraocular Movements: EOM normal.  Neck:     Comments: No ttp of midline cspine. Full active rom of the head, pain with extension  Cardiovascular:     Pulses: Intact distal pulses.  Pulmonary:     Effort: No respiratory distress.  Musculoskeletal:        General: Normal range of motion.     Cervical back: Normal range of motion and neck supple.     Comments: TTP of left trapezius.  Tenderness palpation of the left hand and wrist.  Mild swelling.  Radial pulse 2+ bilaterally.  Decreased grip strength, question due the pain versus weakness.   Skin:    General: Skin is warm and dry.  Psychiatric:        Mood and Affect: Mood and affect normal.     ED Course/Procedures     Procedures  MDM   Pt seen in conjunction with P Badalamente, PA-C.  Please see his notes for full exam, history, and plan.  In brief, patient presenting for evaluation of left shoulder, wrist, hand, left-sided neck pain which began yesterday.  He also swelling to his left wrist and hand.  He had a colonoscopy yesterday, however pain was present before hand.  Pain is worse with movement of the head/neck, especially extension.  No falls or injuries.  No numbness.  No infectious symptoms.  No chest pain or shortness of breath.  On exam, patient with significant pain with ranging any part of his left upper extremity.  He does have mild swelling of the left hand.  Radial pulse 2+ bilaterally.  Decreased grip strength and ability to move the wrist, question due to pain versus weakness.   Additionally, patient with significant pain with palpation of the trapezius, no obvious tenderness palpation midline C-spine.  Most likely radiculopathy/MSK pain.  However also consider left upper extremity DVT in the setting of pain, swelling, and recent procedure.  Less likely cardiac cause, patient without chest pain or shortness of breath.  Will obtain labs, CT of the C-spine, x-ray of the shoulder and chest and treat symptomatically.  CT shows chronic degeneration, no acute abnormalities.  Chest x-ray without acute fracture or mass, does show arthritis.  On reassessment after pain control, patient has improved range of motion and grip strength.  As such, likely radiculopathy/MSK.  Ultrasound is not available at this time, he will return tomorrow for ultrasound to rule out DVT, follow closely with PCP.  Case discussed with attending, Dr. Stevie Kern evaluated patient. Plan for d/c with symptomatic tx.    Alveria Apley, PA-C 12/05/20 1909    Milagros Loll, MD 12/07/20 1210

## 2020-12-23 ENCOUNTER — Other Ambulatory Visit: Payer: Self-pay

## 2020-12-23 ENCOUNTER — Other Ambulatory Visit (HOSPITAL_BASED_OUTPATIENT_CLINIC_OR_DEPARTMENT_OTHER): Payer: Self-pay | Admitting: Emergency Medicine

## 2020-12-23 ENCOUNTER — Encounter (HOSPITAL_BASED_OUTPATIENT_CLINIC_OR_DEPARTMENT_OTHER): Payer: Self-pay

## 2020-12-23 ENCOUNTER — Emergency Department (HOSPITAL_BASED_OUTPATIENT_CLINIC_OR_DEPARTMENT_OTHER)
Admission: EM | Admit: 2020-12-23 | Discharge: 2020-12-23 | Disposition: A | Discharge status: Home / Self Care | Payer: Medicare Other | Attending: Emergency Medicine | Admitting: Emergency Medicine

## 2020-12-23 DIAGNOSIS — Z79899 Other long term (current) drug therapy: Secondary | ICD-10-CM | POA: Diagnosis not present

## 2020-12-23 DIAGNOSIS — I509 Heart failure, unspecified: Secondary | ICD-10-CM | POA: Diagnosis not present

## 2020-12-23 DIAGNOSIS — E119 Type 2 diabetes mellitus without complications: Secondary | ICD-10-CM | POA: Diagnosis not present

## 2020-12-23 DIAGNOSIS — Z951 Presence of aortocoronary bypass graft: Secondary | ICD-10-CM | POA: Insufficient documentation

## 2020-12-23 DIAGNOSIS — I110 Hypertensive heart disease with heart failure: Secondary | ICD-10-CM | POA: Insufficient documentation

## 2020-12-23 DIAGNOSIS — R609 Edema, unspecified: Secondary | ICD-10-CM

## 2020-12-23 DIAGNOSIS — I2510 Atherosclerotic heart disease of native coronary artery without angina pectoris: Secondary | ICD-10-CM | POA: Diagnosis not present

## 2020-12-23 DIAGNOSIS — Z7984 Long term (current) use of oral hypoglycemic drugs: Secondary | ICD-10-CM | POA: Insufficient documentation

## 2020-12-23 DIAGNOSIS — M79601 Pain in right arm: Secondary | ICD-10-CM

## 2020-12-23 DIAGNOSIS — M7989 Other specified soft tissue disorders: Secondary | ICD-10-CM

## 2020-12-23 DIAGNOSIS — Z87891 Personal history of nicotine dependence: Secondary | ICD-10-CM | POA: Diagnosis not present

## 2020-12-23 DIAGNOSIS — Z20822 Contact with and (suspected) exposure to covid-19: Secondary | ICD-10-CM | POA: Insufficient documentation

## 2020-12-23 DIAGNOSIS — Z7982 Long term (current) use of aspirin: Secondary | ICD-10-CM | POA: Diagnosis not present

## 2020-12-23 MED ORDER — MORPHINE SULFATE 15 MG PO TABS: 7.5000 mg | ORAL_TABLET | ORAL | 0 refills | Status: DC | PRN

## 2020-12-23 MED ORDER — ENOXAPARIN SODIUM 100 MG/ML ~~LOC~~ SOLN
100.0000 mg | Freq: Once | SUBCUTANEOUS | Status: AC
  Administered 2020-12-23: 100 mg via SUBCUTANEOUS
  Filled 2020-12-23: qty 1

## 2020-12-23 MED ORDER — ENOXAPARIN SODIUM 100 MG/ML ~~LOC~~ SOLN: 100.0000 mg | Freq: Once | SUBCUTANEOUS | Status: DC

## 2020-12-23 MED ORDER — KETOROLAC TROMETHAMINE 60 MG/2ML IM SOLN
15.0000 mg | Freq: Once | INTRAMUSCULAR | Status: AC
  Administered 2020-12-23: 15 mg via INTRAMUSCULAR
  Filled 2020-12-23: qty 2

## 2020-12-23 MED ORDER — ACETAMINOPHEN 500 MG PO TABS
1000.0000 mg | ORAL_TABLET | Freq: Once | ORAL | Status: AC
  Administered 2020-12-23: 1000 mg via ORAL
  Filled 2020-12-23: qty 2

## 2020-12-23 MED ORDER — OXYCODONE HCL 5 MG PO TABS
5.0000 mg | ORAL_TABLET | Freq: Once | ORAL | Status: AC
Start: 2020-12-23 — End: 2020-12-23
  Administered 2020-12-23: 5 mg via ORAL
  Filled 2020-12-23: qty 1

## 2020-12-23 MED FILL — MORPHINE SULFATE IR 15 MG T: 15 | 1 days supply | Qty: 3 | Fill #0

## 2020-12-23 NOTE — ED Provider Notes (Addendum)
[facility] EMERGENCY DEPARTMENT Provider Note   CSN: [id number] Arrival date & time: 12/23/20  1247     History Chief Complaint  Patient presents with  . Arm Pain    Oscar Shelton is a 50 y.o. male.  50 yo M with a chief complaints of R arm pain and swelling.  Going on for the past three days.  He denies any injury to the area.  Started to the anterior aspect of the right shoulder and now feels like it is progressed down his arm worse to the ulnar aspect of the hand and wrist.  Denies any fevers.  Denies chest pain or trouble breathing.  Denies feeling like he may pass out.  Has never had pain like this before.  The history is provided by the patient.  Arm Pain This is a new problem. The current episode started yesterday. The problem occurs constantly. The problem has not changed since onset.Pertinent negatives include no chest pain, no abdominal pain, no headaches and no shortness of breath. Nothing aggravates the symptoms. Nothing relieves the symptoms. He has tried nothing for the symptoms. The treatment provided no relief.       Past Medical History:  Diagnosis Date  . CHF (congestive heart failure) (HCC)   . Coronary artery disease   . Diabetes mellitus without complication (HCC)   . Hypertension     Patient Active Problem List   Diagnosis Date Noted  . Multifocal pneumonia 11/24/2016  . Essential hypertension 06/11/2015  . Coronary artery disease involving native coronary artery of native heart without angina pectoris 05/17/2015  . Ischemic cardiomyopathy 05/17/2015  . Type 2 diabetes mellitus without complication, without long-term current use of insulin (HCC) 09/15/2013    Past Surgical History:  Procedure Laterality Date  . CORONARY ARTERY BYPASS GRAFT         Family History  Problem Relation Age of Onset  . Hypertension Mother   . Diabetes Father   . Heart disease Father     Social History   Tobacco Use  . Smoking status: Former Games developer   . Smokeless tobacco: Never Used  Vaping Use  . Vaping Use: Never used  Substance Use Topics  . Alcohol use: Yes    Comment: occ  . Drug use: No    Home Medications Prior to Admission medications   Medication Sig Start Date End Date Taking? Authorizing Provider  aspirin EC 81 MG tablet Take 1 tablet by mouth daily.    [provider]  atorvastatin (LIPITOR) 80 MG tablet Take 80 mg by mouth daily. 11/21/16   [provider]  benzonatate (TESSALON) 100 MG capsule Take 1 capsule (100 mg total) by mouth every 8 (eight) hours. 04/19/18   Maczis, Elmer Sow, PA-C  carvedilol (COREG) 3.125 MG tablet Take 1 tablet by mouth 2 (two) times daily. 03/21/18   [provider]  furosemide (LASIX) 20 MG tablet Take 3 tablets by mouth 2 (two) times daily. 03/21/18 03/21/19  [provider]  furosemide (LASIX) 20 MG tablet Take 5 tablets (100 mg total) by mouth 2 (two) times daily for 5 days. 04/19/18 04/24/18  Maczis, Elmer Sow, PA-C  gabapentin (NEURONTIN) 300 MG capsule Take 1 capsule by mouth 3 (three) times daily. 04/15/18   [provider]  glyBURIDE (DIABETA) 5 MG tablet Take 5 mg by mouth daily. 12/10/14   [provider]  lidocaine (LIDODERM) 5 % Place 1 patch onto the skin daily. Remove & Discard patch within 12  hours or as directed by MD 09/23/20   Horton, Mayer Masker, MD  losartan (COZAAR) 25 MG tablet Take 25 mg by mouth daily. 11/21/16   [provider]  metFORMIN (GLUCOPHAGE) 850 MG tablet Take 1 tablet by mouth 2 (two) times daily. 04/09/18   [provider]  methocarbamol (ROBAXIN) 500 MG tablet Take 1 tablet (500 mg total) by mouth every 8 (eight) hours as needed for muscle spasms. 12/05/20   Haskel Schroeder, PA-C  naproxen (NAPROSYN) 500 MG tablet Take 1 tablet (500 mg total) by mouth 2 (two) times daily. 09/23/20   Horton, Mayer Masker, MD  spironolactone (ALDACTONE) 25 MG tablet Take 1 tablet by mouth daily. 03/21/18   [provider]    Allergies    Patient has no known allergies.  Review of Systems   Review of Systems  Constitutional: Negative for chills and fever.  HENT: Negative for congestion and facial swelling.   Eyes: Negative for discharge and visual disturbance.  Respiratory: Negative for shortness of breath.   Cardiovascular: Negative for chest pain and palpitations.  Gastrointestinal: Negative for abdominal pain, diarrhea and vomiting.  Musculoskeletal: Positive for arthralgias and myalgias.  Skin: Positive for color change. Negative for rash.  Neurological: Negative for tremors, syncope and headaches.  Psychiatric/Behavioral: Negative for confusion and dysphoric mood.    Physical Exam Updated Vital Signs BP (!) 155/102 (BP Location: Left Arm)   Pulse 75   Temp 98.9 F (37.2 C) (Oral)   Resp 20   SpO2 99%   Physical Exam Vitals and nursing note reviewed.  Constitutional:      Appearance: He is well-developed and well-nourished.  HENT:     Head: Normocephalic and atraumatic.  Eyes:     Extraocular Movements: EOM normal.     Pupils: Pupils are equal, round, and reactive to light.  Neck:     Vascular: No JVD.  Cardiovascular:     Rate and Rhythm: Normal rate and regular rhythm.     Heart sounds: No murmur heard. No friction rub. No gallop.   Pulmonary:     Effort: No respiratory distress.     Breath sounds: No wheezing.  Abdominal:     General: There is no distension.     Tenderness: There is no guarding or rebound.  Musculoskeletal:        General: Swelling and tenderness present. Normal range of motion.     Cervical back: Normal range of motion and neck supple.     Comments: Pain and swelling to the right upper extremity starting just above the elbow and extending down the arm.  Has some mild focal tenderness to the lateral aspect of the right hand.  No obvious deformity or crepitus.  Able to range the wrist and the elbow without significant pain.  Mild pain to the  anterior aspect of the right shoulder.  No JVD.  Pulse and sensation intact distally.  Mild reduced motor secondary to pain.  Skin:    Coloration: Skin is not pale.     Findings: No rash.  Neurological:     Mental Status: He is alert and oriented to person, place, and time.  Psychiatric:        Mood and Affect: Mood and affect normal.        Behavior: Behavior normal.     ED Results / Procedures / Treatments   Labs (all labs ordered are listed, but only abnormal results are displayed) Labs Reviewed - No data  to display  EKG None  Radiology No results found.  Procedures Procedures (including critical care time)  Medications Ordered in ED Medications  acetaminophen (TYLENOL) tablet 1,000 mg (has no administration in time range)  ketorolac (TORADOL) injection 15 mg (has no administration in time range)  oxyCODONE (Oxy IR/ROXICODONE) immediate release tablet 5 mg (has no administration in time range)    ED Course  I have reviewed the triage vital signs and the nursing notes.  Pertinent labs & imaging results that were available during my care of the patient were reviewed by me and considered in my medical decision making (see chart for details).    MDM Rules/Calculators/A&P                          50 yo M with a chief complaint of right arm pain and swelling.  Going on for the past 3 days.  Patient's presentation is concerning for a DVT.  Has had a PICC line in that arm previously.  Unfortunately ultrasound is not available here.  I discussed the case with Dr. Reather Converse who accepts the patient in transfer ED to ED to [facility].  The patients results and plan were reviewed and discussed.   Any x-rays performed were independently reviewed by myself.   Differential diagnosis were considered with the presenting HPI.   The patient would prefer not to get the DVT study today.  I did discuss with him the risks of waiting.  We will have a DVT study performed in  [place].   Medications  acetaminophen (TYLENOL) tablet 1,000 mg (has no administration in time range)  ketorolac (TORADOL) injection 15 mg (has no administration in time range)  oxyCODONE (Oxy IR/ROXICODONE) immediate release tablet 5 mg (has no administration in time range)    Vitals:   12/23/20 1257 12/23/20 1451  BP: (!) 168/105 (!) 155/102  Pulse: (!) 119 75  Resp: 20 20  Temp: 98.9 F (37.2 C)   TempSrc: Oral   SpO2: 100% 99%    Final diagnoses:  Right arm pain  Arm swelling      Final Clinical Impression(s) / ED Diagnoses Final diagnoses:  Right arm pain  Arm swelling    Rx / DC Orders ED Discharge Orders    None       Deno Etienne, DO 12/23/20 [place], [place], DO 12/23/20 1643

## 2020-12-23 NOTE — Discharge Instructions (Addendum)
Get an ultrasound of your arm tomorrow  Try to elevate this above the level of your heart as best you can.  Follow-up with sports medicine if your DVT study is negative.  Please return to the ED for fever worsening pain or if you feel like you might pass out or do pass out or if you develop chest pain or trouble breathing.  Take 4 over the counter ibuprofen tablets 3 times a day or 2 over-the-counter naproxen tablets twice a day for pain. Also take tylenol 1000mg (2 extra strength) four times a day.   Then take the pain medicine if you feel like you need it. Narcotics do not help with the pain, they only make you care about it less.  You can become addicted to this, people may break into your house to steal it.  It will constipate you.  If you drive under the influence of this medicine you can get a DUI.

## 2020-12-23 NOTE — ED Triage Notes (Signed)
Pt c/o pain/swelling to right UE from shoulder to hand-denies injury-states he is unable to lift arm due to pain-pt with steady gait/grimacing

## 2020-12-23 NOTE — ED Notes (Signed)
Pt to follow up at [facility] for ultrasound at 1100 am.

## 2020-12-24 ENCOUNTER — Ambulatory Visit (INDEPENDENT_AMBULATORY_CARE_PROVIDER_SITE_OTHER): Payer: Medicare Other

## 2020-12-24 ENCOUNTER — Other Ambulatory Visit: Payer: Medicare Other

## 2020-12-24 DIAGNOSIS — R2231 Localized swelling, mass and lump, right upper limb: Secondary | ICD-10-CM | POA: Diagnosis not present

## 2020-12-24 DIAGNOSIS — R52 Pain, unspecified: Secondary | ICD-10-CM

## 2020-12-24 LAB — SARS CORONAVIRUS 2 (TAT 6-24 HRS): SARS Coronavirus 2: NEGATIVE

## 2020-12-31 ENCOUNTER — Ambulatory Visit: Payer: Medicare Other | Admitting: Family Medicine

## 2020-12-31 NOTE — Progress Notes (Incomplete)
  Froylan Keesey - 50 y.o. male MRN [id number]  Date of birth: 02/03/1971  SUBJECTIVE:  Including CC & ROS.  No chief complaint on file.   Diangelo Brickner is a 50 y.o. male that is  ***.  ***   Review of Systems See HPI   HISTORY: Past Medical, Surgical, Social, and Family History Reviewed & Updated per EMR.   Pertinent Historical Findings include:  Past Medical History:  Diagnosis Date  . CHF (congestive heart failure) (HCC)   . Coronary artery disease   . Diabetes mellitus without complication (HCC)   . Hypertension     Past Surgical History:  Procedure Laterality Date  . CORONARY ARTERY BYPASS GRAFT      Family History  Problem Relation Age of Onset  . Hypertension Mother   . Diabetes Father   . Heart disease Father     Social History   Socioeconomic History  . Marital status: Married    Spouse name: Not on file  . Number of children: Not on file  . Years of education: Not on file  . Highest education level: Not on file  Occupational History  . Not on file  Tobacco Use  . Smoking status: Former Smoker  . Smokeless tobacco: Never Used  Vaping Use  . Vaping Use: Never used  Substance and Sexual Activity  . Alcohol use: Yes    Comment: occ  . Drug use: No  . Sexual activity: Not on file  Other Topics Concern  . Not on file  Social History Narrative  . Not on file   Social Determinants of Health   Financial Resource Strain: Not on file  Food Insecurity: Not on file  Transportation Needs: Not on file  Physical Activity: Not on file  Stress: Not on file  Social Connections: Not on file  Intimate Partner Violence: Not on file     PHYSICAL EXAM:  VS: There were no vitals taken for this visit. Physical Exam Gen: NAD, alert, cooperative with exam, well-appearing MSK:  ***      ASSESSMENT & PLAN:   No problem-specific Assessment & Plan notes found for this encounter.     

## 2021-01-01 ENCOUNTER — Ambulatory Visit: Payer: Medicare Other | Admitting: Family Medicine

## 2021-01-01 NOTE — Progress Notes (Deleted)
  Zacchaeus Halm - 50 y.o. male MRN [id number]  Date of birth: August 31, 1971  SUBJECTIVE:  Including CC & ROS.  No chief complaint on file.   Rodolfo Gaster is a 50 y.o. male that is  ***.  ***   Review of Systems See HPI   HISTORY: Past Medical, Surgical, Social, and Family History Reviewed & Updated per EMR.   Pertinent Historical Findings include:  Past Medical History:  Diagnosis Date  . CHF (congestive heart failure) (HCC)   . Coronary artery disease   . Diabetes mellitus without complication (HCC)   . Hypertension     Past Surgical History:  Procedure Laterality Date  . CORONARY ARTERY BYPASS GRAFT      Family History  Problem Relation Age of Onset  . Hypertension Mother   . Diabetes Father   . Heart disease Father     Social History   Socioeconomic History  . Marital status: Married    Spouse name: Not on file  . Number of children: Not on file  . Years of education: Not on file  . Highest education level: Not on file  Occupational History  . Not on file  Tobacco Use  . Smoking status: Former Games developer  . Smokeless tobacco: Never Used  Vaping Use  . Vaping Use: Never used  Substance and Sexual Activity  . Alcohol use: Yes    Comment: occ  . Drug use: No  . Sexual activity: Not on file  Other Topics Concern  . Not on file  Social History Narrative  . Not on file   Social Determinants of Health   Financial Resource Strain: Not on file  Food Insecurity: Not on file  Transportation Needs: Not on file  Physical Activity: Not on file  Stress: Not on file  Social Connections: Not on file  Intimate Partner Violence: Not on file     PHYSICAL EXAM:  VS: There were no vitals taken for this visit. Physical Exam Gen: NAD, alert, cooperative with exam, well-appearing MSK:  ***      ASSESSMENT & PLAN:   No problem-specific Assessment & Plan notes found for this encounter.

## 2021-09-29 IMAGING — US US EXTREM  UP VENOUS*R*
1 series · 13 of 24 positions shown · non-contrast
Comparison: None.

CLINICAL DATA: RIGHT arm swelling for 3 days



[Series 1: us extrem up venous*right* · 0.08mm/px · 13 of 45 slices shown]
[im 1/45]
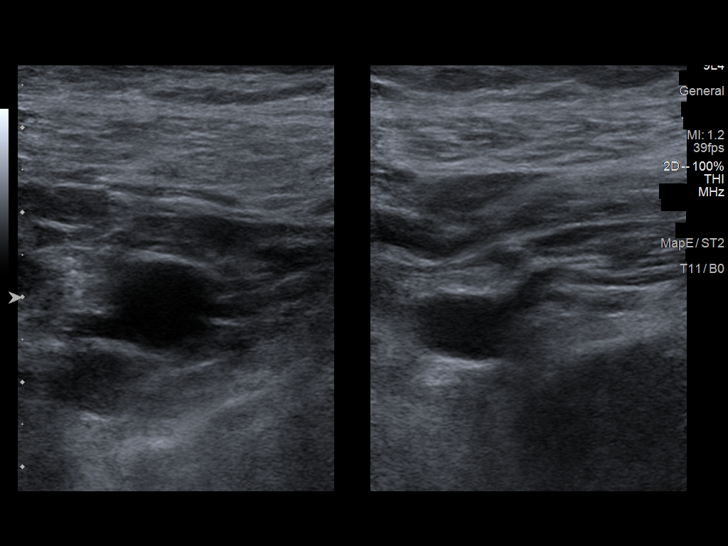
[im 4/45]
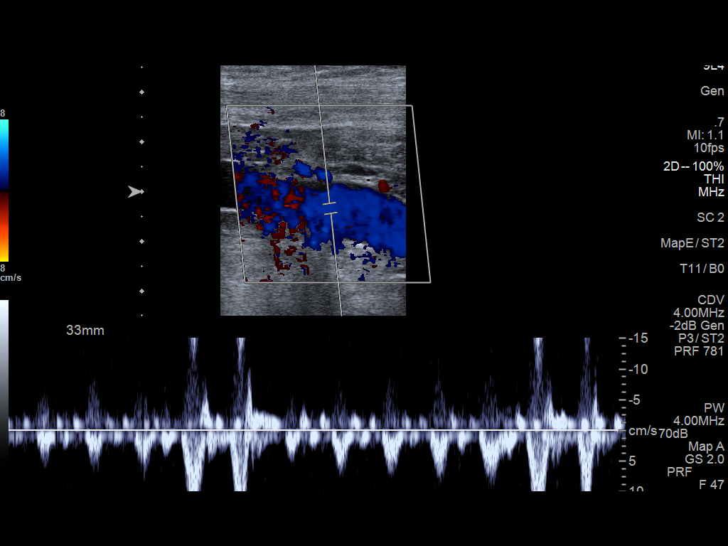
[im 8/45]
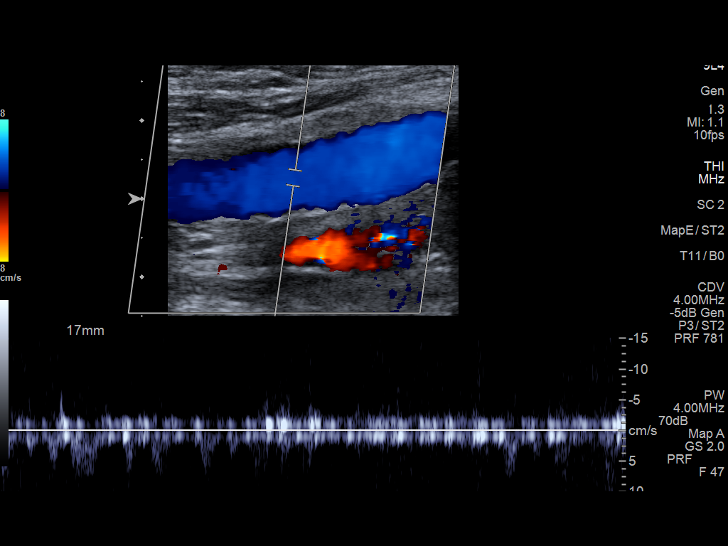
[im 12/45]
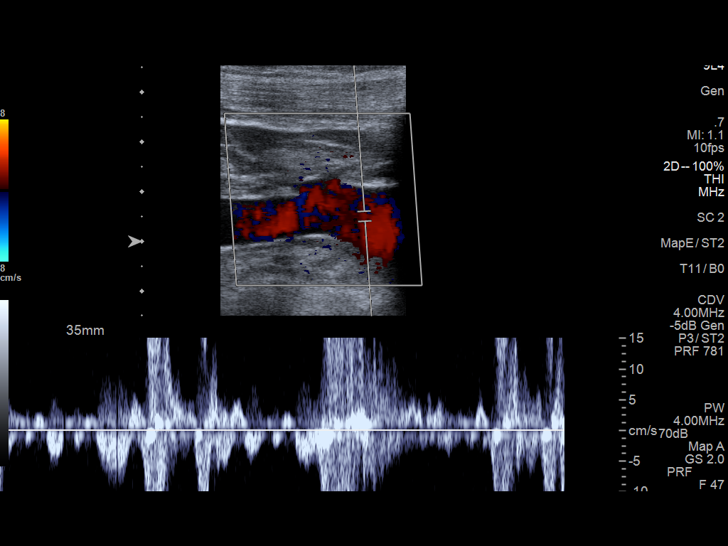
[im 16/45]
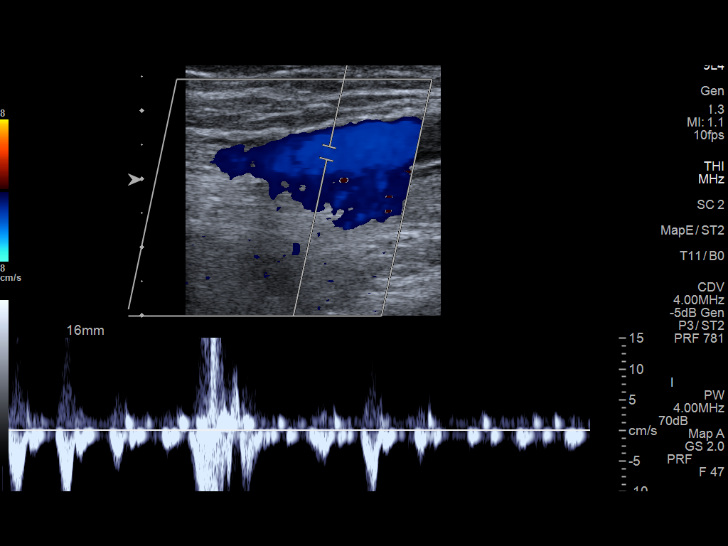
[im 20/45]
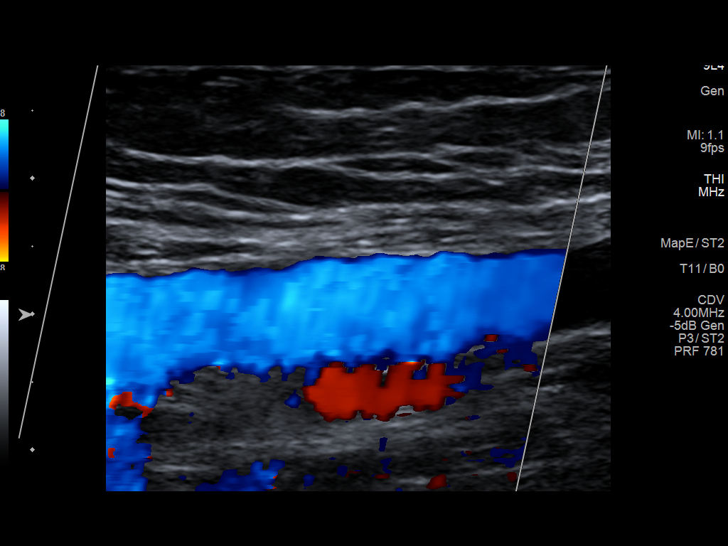
[im 23/45]
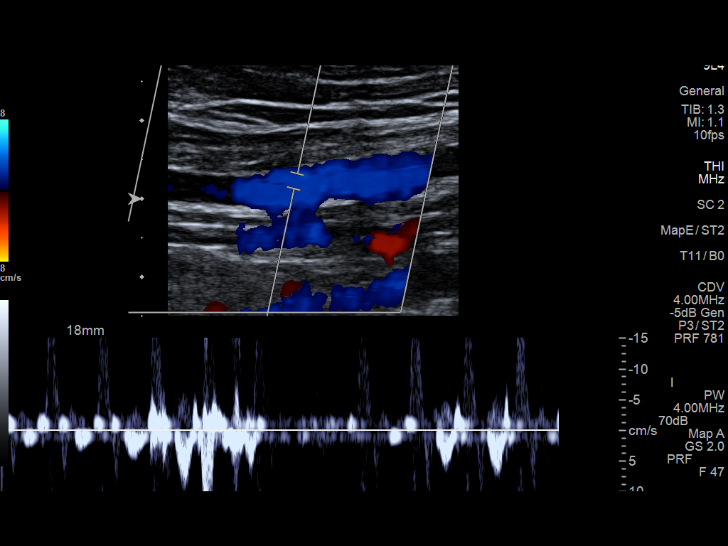
[im 25/45]
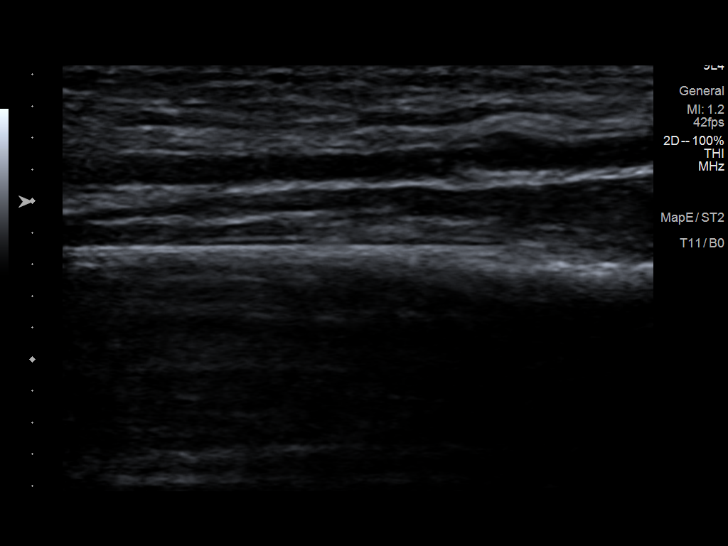
[im 29/45]
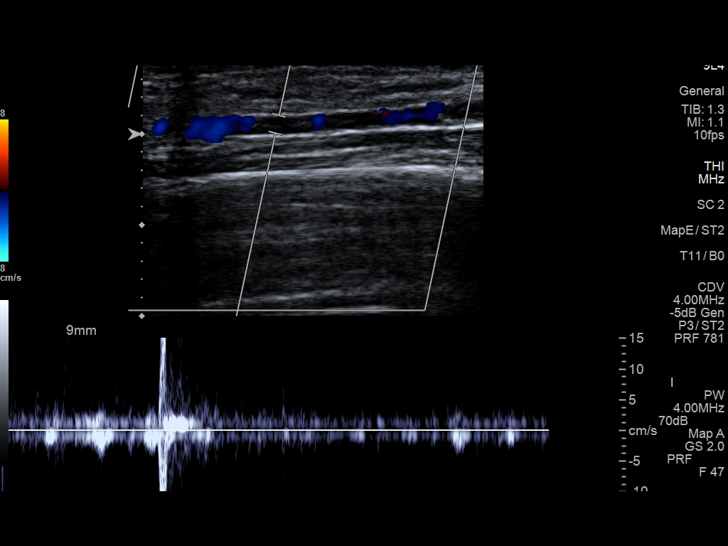
[im 33/45]
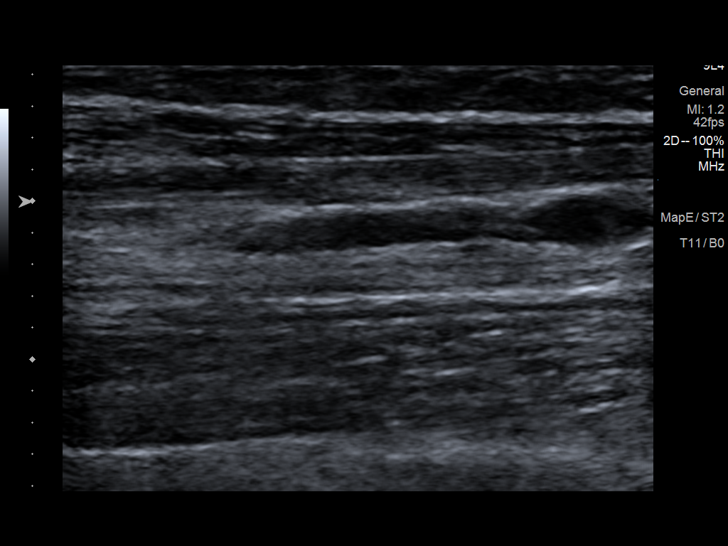
[im 37/45]
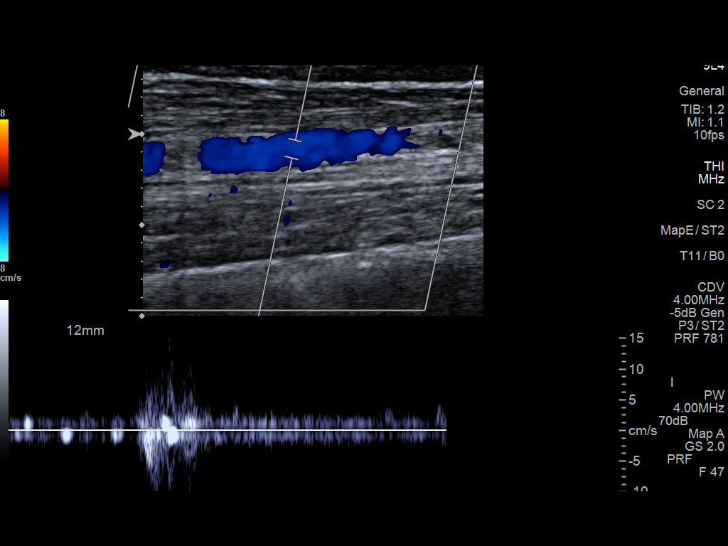
[im 41/45]
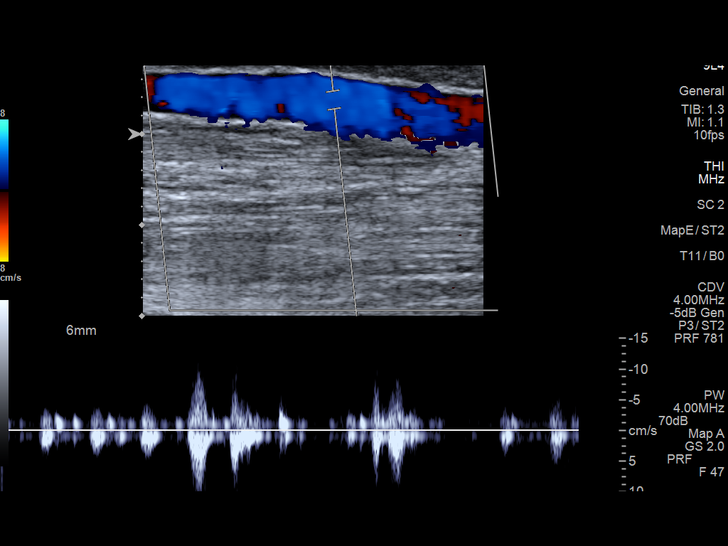
[im 45/45]
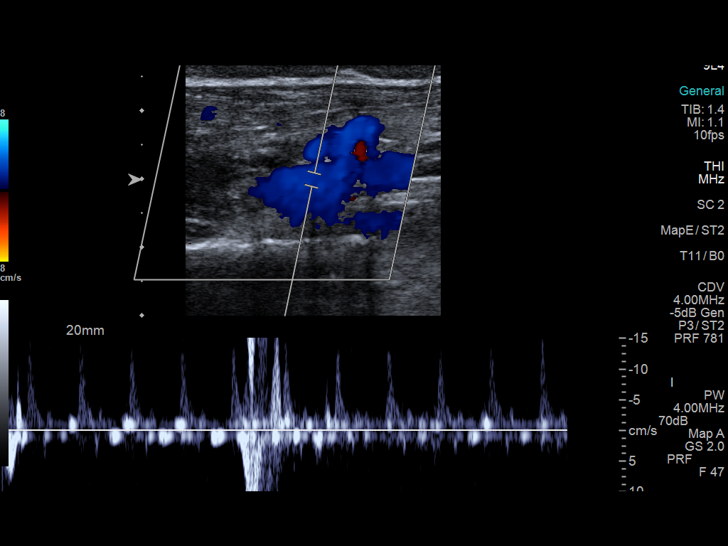

[13 of 24 positions shown; findings below may reference images not displayed]

FINDINGS: Contralateral Subclavian Vein: Respiratory phasicity is normal and
symmetric with the symptomatic side. No evidence of thrombus. Normal
compressibility.

Internal Jugular Vein: No evidence of thrombus. Normal
compressibility, respiratory phasicity and response to augmentation.

Subclavian Vein: No evidence of thrombus. Normal compressibility,
respiratory phasicity and response to augmentation.

Axillary Vein: No evidence of thrombus. Normal compressibility,
respiratory phasicity and response to augmentation.

Cephalic Vein: No evidence of thrombus. Normal compressibility,
respiratory phasicity and response to augmentation.

Basilic Vein: No evidence of thrombus. Normal compressibility,
respiratory phasicity and response to augmentation.

Brachial Veins: No evidence of thrombus. Normal compressibility,
respiratory phasicity and response to augmentation.

Radial Veins: No evidence of thrombus. Normal compressibility,
respiratory phasicity and response to augmentation.

Ulnar Veins: No evidence of thrombus. Normal compressibility,
respiratory phasicity and response to augmentation.

Other Findings:  None visualized.
IMPRESSION: No evidence of DVT within the RIGHT upper extremity.

## 2023-04-05 ENCOUNTER — Encounter: Payer: Self-pay | Admitting: *Deleted
